# Patient Record
Sex: Female | Born: 1975 | Race: White | Hispanic: No | Marital: Married | Smoking: Current every day smoker
Health system: Southern US, Community
[De-identification: ages and names within clinical notes are randomized; demographics above are authoritative.]

## PROBLEM LIST (undated history)

## (undated) DIAGNOSIS — N838 Other noninflammatory disorders of ovary, fallopian tube and broad ligament: Secondary | ICD-10-CM

## (undated) DIAGNOSIS — I10 Essential (primary) hypertension: Secondary | ICD-10-CM

## (undated) DIAGNOSIS — C801 Malignant (primary) neoplasm, unspecified: Secondary | ICD-10-CM

## (undated) HISTORY — PX: TUBAL LIGATION: SHX77

## (undated) HISTORY — PX: CERVIX REMOVAL: SHX592

## (undated) HISTORY — PX: TONSILLECTOMY: SUR1361

---

## 1998-02-21 ENCOUNTER — Emergency Department (HOSPITAL_COMMUNITY): Admission: EM | Admit: 1998-02-21 | Discharge: 1998-02-21 | Payer: Self-pay | Admitting: Emergency Medicine

## 1998-04-01 ENCOUNTER — Emergency Department (HOSPITAL_COMMUNITY): Admission: EM | Admit: 1998-04-01 | Discharge: 1998-04-01 | Payer: Self-pay | Admitting: Emergency Medicine

## 1998-06-02 ENCOUNTER — Emergency Department (HOSPITAL_COMMUNITY): Admission: EM | Admit: 1998-06-02 | Discharge: 1998-06-02 | Payer: Self-pay | Admitting: Emergency Medicine

## 1999-03-22 ENCOUNTER — Emergency Department (HOSPITAL_COMMUNITY): Admission: EM | Admit: 1999-03-22 | Discharge: 1999-03-22 | Payer: Self-pay | Admitting: Emergency Medicine

## 1999-03-22 ENCOUNTER — Encounter: Payer: Self-pay | Admitting: Emergency Medicine

## 1999-03-29 ENCOUNTER — Emergency Department (HOSPITAL_COMMUNITY): Admission: EM | Admit: 1999-03-29 | Discharge: 1999-03-29 | Payer: Self-pay | Admitting: Emergency Medicine

## 1999-04-23 ENCOUNTER — Encounter: Payer: Self-pay | Admitting: Emergency Medicine

## 1999-04-23 ENCOUNTER — Emergency Department (HOSPITAL_COMMUNITY): Admission: EM | Admit: 1999-04-23 | Discharge: 1999-04-23 | Payer: Self-pay | Admitting: Emergency Medicine

## 1999-05-27 ENCOUNTER — Emergency Department (HOSPITAL_COMMUNITY): Admission: EM | Admit: 1999-05-27 | Discharge: 1999-05-27 | Payer: Self-pay | Admitting: Emergency Medicine

## 1999-11-15 ENCOUNTER — Encounter: Payer: Self-pay | Admitting: *Deleted

## 1999-11-15 ENCOUNTER — Emergency Department (HOSPITAL_COMMUNITY): Admission: EM | Admit: 1999-11-15 | Discharge: 1999-11-15 | Payer: Self-pay | Admitting: Podiatry

## 1999-11-23 ENCOUNTER — Inpatient Hospital Stay (HOSPITAL_COMMUNITY): Admission: AD | Admit: 1999-11-23 | Discharge: 1999-11-23 | Payer: Self-pay | Admitting: *Deleted

## 1999-12-27 ENCOUNTER — Other Ambulatory Visit: Admission: RE | Admit: 1999-12-27 | Discharge: 1999-12-27 | Payer: Self-pay | Admitting: Obstetrics

## 1999-12-27 ENCOUNTER — Encounter (INDEPENDENT_AMBULATORY_CARE_PROVIDER_SITE_OTHER): Payer: Self-pay

## 2000-08-30 ENCOUNTER — Emergency Department (HOSPITAL_COMMUNITY): Admission: EM | Admit: 2000-08-30 | Discharge: 2000-08-30 | Payer: Self-pay | Admitting: Emergency Medicine

## 2000-08-30 ENCOUNTER — Encounter: Payer: Self-pay | Admitting: Emergency Medicine

## 2000-09-15 ENCOUNTER — Encounter: Payer: Self-pay | Admitting: Cardiology

## 2000-09-15 ENCOUNTER — Ambulatory Visit (HOSPITAL_COMMUNITY): Admission: RE | Admit: 2000-09-15 | Discharge: 2000-09-15 | Payer: Self-pay | Admitting: Cardiology

## 2000-11-08 ENCOUNTER — Ambulatory Visit (HOSPITAL_COMMUNITY): Admission: RE | Admit: 2000-11-08 | Discharge: 2000-11-08 | Payer: Self-pay | Admitting: Neurology

## 2000-11-08 ENCOUNTER — Encounter: Payer: Self-pay | Admitting: Neurology

## 2001-01-05 ENCOUNTER — Emergency Department (HOSPITAL_COMMUNITY): Admission: EM | Admit: 2001-01-05 | Discharge: 2001-01-05 | Payer: Self-pay | Admitting: Emergency Medicine

## 2001-04-12 ENCOUNTER — Encounter: Admission: RE | Admit: 2001-04-12 | Discharge: 2001-04-26 | Payer: Self-pay | Admitting: Neurology

## 2001-05-29 ENCOUNTER — Encounter: Admission: RE | Admit: 2001-05-29 | Discharge: 2001-05-29 | Payer: Self-pay | Admitting: Cardiology

## 2001-05-29 ENCOUNTER — Encounter: Payer: Self-pay | Admitting: Cardiology

## 2001-06-07 ENCOUNTER — Emergency Department (HOSPITAL_COMMUNITY): Admission: EM | Admit: 2001-06-07 | Discharge: 2001-06-07 | Payer: Self-pay

## 2001-06-14 ENCOUNTER — Emergency Department (HOSPITAL_COMMUNITY): Admission: EM | Admit: 2001-06-14 | Discharge: 2001-06-14 | Payer: Self-pay | Admitting: Emergency Medicine

## 2001-09-14 ENCOUNTER — Emergency Department (HOSPITAL_COMMUNITY): Admission: EM | Admit: 2001-09-14 | Discharge: 2001-09-14 | Payer: Self-pay | Admitting: *Deleted

## 2001-11-25 ENCOUNTER — Emergency Department (HOSPITAL_COMMUNITY): Admission: EM | Admit: 2001-11-25 | Discharge: 2001-11-25 | Payer: Self-pay | Admitting: Emergency Medicine

## 2001-11-27 ENCOUNTER — Encounter: Admission: RE | Admit: 2001-11-27 | Discharge: 2002-01-09 | Payer: Self-pay | Admitting: Orthopedic Surgery

## 2003-02-13 ENCOUNTER — Emergency Department (HOSPITAL_COMMUNITY): Admission: EM | Admit: 2003-02-13 | Discharge: 2003-02-13 | Payer: Self-pay | Admitting: Emergency Medicine

## 2003-02-13 ENCOUNTER — Encounter: Payer: Self-pay | Admitting: Emergency Medicine

## 2003-09-01 ENCOUNTER — Emergency Department (HOSPITAL_COMMUNITY): Admission: EM | Admit: 2003-09-01 | Discharge: 2003-09-01 | Payer: Self-pay | Admitting: Emergency Medicine

## 2003-09-01 ENCOUNTER — Encounter: Payer: Self-pay | Admitting: Emergency Medicine

## 2004-10-27 ENCOUNTER — Encounter: Admission: RE | Admit: 2004-10-27 | Discharge: 2004-10-27 | Payer: Self-pay | Admitting: Cardiology

## 2005-04-13 ENCOUNTER — Ambulatory Visit (HOSPITAL_COMMUNITY): Admission: RE | Admit: 2005-04-13 | Discharge: 2005-04-13 | Payer: Self-pay | Admitting: *Deleted

## 2005-07-14 ENCOUNTER — Ambulatory Visit (HOSPITAL_COMMUNITY): Admission: RE | Admit: 2005-07-14 | Discharge: 2005-07-14 | Payer: Self-pay | Admitting: *Deleted

## 2005-08-30 ENCOUNTER — Inpatient Hospital Stay (HOSPITAL_COMMUNITY): Admission: AD | Admit: 2005-08-30 | Discharge: 2005-09-01 | Payer: Self-pay | Admitting: *Deleted

## 2005-08-30 ENCOUNTER — Ambulatory Visit: Payer: Self-pay | Admitting: Family Medicine

## 2005-08-31 ENCOUNTER — Encounter (INDEPENDENT_AMBULATORY_CARE_PROVIDER_SITE_OTHER): Payer: Self-pay | Admitting: *Deleted

## 2005-09-02 ENCOUNTER — Inpatient Hospital Stay (HOSPITAL_COMMUNITY): Admission: AD | Admit: 2005-09-02 | Discharge: 2005-09-02 | Payer: Self-pay | Admitting: Obstetrics & Gynecology

## 2006-05-05 ENCOUNTER — Emergency Department (HOSPITAL_COMMUNITY): Admission: EM | Admit: 2006-05-05 | Discharge: 2006-05-06 | Payer: Self-pay | Admitting: Emergency Medicine

## 2007-12-30 ENCOUNTER — Inpatient Hospital Stay (HOSPITAL_COMMUNITY): Admission: EM | Admit: 2007-12-30 | Discharge: 2007-12-31 | Payer: Self-pay | Admitting: Emergency Medicine

## 2008-01-09 ENCOUNTER — Emergency Department (HOSPITAL_COMMUNITY): Admission: EM | Admit: 2008-01-09 | Discharge: 2008-01-09 | Payer: Self-pay | Admitting: Emergency Medicine

## 2008-01-27 ENCOUNTER — Emergency Department (HOSPITAL_COMMUNITY): Admission: EM | Admit: 2008-01-27 | Discharge: 2008-01-27 | Payer: Self-pay | Admitting: Family Medicine

## 2008-01-28 ENCOUNTER — Emergency Department (HOSPITAL_COMMUNITY): Admission: EM | Admit: 2008-01-28 | Discharge: 2008-01-28 | Payer: Self-pay | Admitting: Emergency Medicine

## 2008-02-08 ENCOUNTER — Emergency Department (HOSPITAL_COMMUNITY): Admission: EM | Admit: 2008-02-08 | Discharge: 2008-02-08 | Payer: Self-pay | Admitting: Family Medicine

## 2008-07-10 ENCOUNTER — Emergency Department (HOSPITAL_COMMUNITY): Admission: EM | Admit: 2008-07-10 | Discharge: 2008-07-11 | Payer: Self-pay | Admitting: Emergency Medicine

## 2008-11-04 ENCOUNTER — Emergency Department (HOSPITAL_COMMUNITY): Admission: EM | Admit: 2008-11-04 | Discharge: 2008-11-04 | Payer: Self-pay | Admitting: Emergency Medicine

## 2009-01-02 ENCOUNTER — Emergency Department (HOSPITAL_COMMUNITY): Admission: EM | Admit: 2009-01-02 | Discharge: 2009-01-02 | Payer: Self-pay | Admitting: Family Medicine

## 2009-09-01 ENCOUNTER — Emergency Department (HOSPITAL_COMMUNITY): Admission: EM | Admit: 2009-09-01 | Discharge: 2009-09-01 | Payer: Self-pay | Admitting: Family Medicine

## 2010-05-14 ENCOUNTER — Emergency Department (HOSPITAL_COMMUNITY): Admission: EM | Admit: 2010-05-14 | Discharge: 2010-05-14 | Payer: Self-pay | Admitting: Family Medicine

## 2011-03-14 ENCOUNTER — Other Ambulatory Visit: Payer: Self-pay | Admitting: Rehabilitation

## 2011-03-14 DIAGNOSIS — M545 Low back pain: Secondary | ICD-10-CM

## 2011-03-16 ENCOUNTER — Ambulatory Visit
Admission: RE | Admit: 2011-03-16 | Discharge: 2011-03-16 | Disposition: A | Discharge status: Home / Self Care | Payer: Self-pay | Source: Ambulatory Visit | Attending: Rehabilitation | Admitting: Rehabilitation

## 2011-03-16 DIAGNOSIS — M545 Low back pain: Secondary | ICD-10-CM
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
## 2011-04-08 NOTE — Op Note (Signed)
NAMEMAECIE, SEVCIK            ACCOUNT NO.:  [id number]   MEDICAL RECORD NO.:  [id number]          PATIENT TYPE:  INP   LOCATION:  9106                          FACILITY:  [facility]   PHYSICIAN:  Phil D. Okey Dupre, M.D.     DATE OF BIRTH:  [birth year]   DATE OF PROCEDURE:  08/31/2005  DATE OF DISCHARGE:                                 OPERATIVE REPORT   PROCEDURE:  Bilateral tubal ligation and partial bilateral salpingectomy.   PREOPERATIVE DIAGNOSIS:  Voluntary sterilization.   POSTOPERATIVE DIAGNOSIS:  Voluntary sterilization.   SURGEON:  Javier Glazier. Okey Dupre, M.D.   ANESTHESIA:  General.   ESTIMATED BLOOD LOSS:  10 mL.   SPECIMENS TO PATHOLOGY:  Portion of each fallopian tube.   PROCEDURE:  Under satisfactory general anesthesia, the patient in supine  position, the abdomen was prepped and draped in the usual sterile manner and  entered through a subumbilical semicircular incision just below the  umbilicus.  It was extended for a total length of 4 cm.  The abdomen was  entered by layers and on entering the peritoneal cavity, the right fallopian  tube was identified, grasped in the midportion and followed down to the  fimbriae and an opening made in the avascular portion of broad ligament with  a hemostat, through which was drawn a #1 plain catgut suture, tied around  the distal and proximal ends of the tube to a form a loop above the tie of  approximately 2 cm in length.  A second free tie was placed just below the  aforementioned tie.  The section of tube above the tie was excised, the free  ends of the tube exposed.  It was coagulated with hot cautery.  The same  procedure was carried out with relationship to the left fallopian tube.  A 0  Vicryl suture was then used to close the peritoneum and fascia in one layer  with continuous running suture.  This was run up for subcuticular closure  and Dermabond was used for skin edge closure.  A dry sterile dressing was  applied.  The patient  transferred to the recovery room in satisfactory  condition.  Tape, instrument, sponge count were reported as correct.           ______________________________  Javier Glazier. Okey Dupre, M.D.     PDR/MEDQ  D:  08/31/2005  T:  08/31/2005  Job:  [id number]

## 2011-08-12 LAB — I-STAT 8, (EC8 V) (CONVERTED LAB)
Acid-base deficit: 5 — ABNORMAL HIGH
BUN: 12
Bicarbonate: 20.4
Chloride: 109
Glucose, Bld: 77
HCT: 41
Hemoglobin: 13.9
Operator id: 277751
Potassium: 4.6
Sodium: 137
TCO2: 21
pCO2, Ven: 38 — ABNORMAL LOW
pH, Ven: 7.336 — ABNORMAL HIGH

## 2011-08-12 LAB — RAPID URINE DRUG SCREEN, HOSP PERFORMED
Amphetamines: NOT DETECTED
Barbiturates: NOT DETECTED
Benzodiazepines: POSITIVE — AB
Cocaine: POSITIVE — AB
Opiates: POSITIVE — AB
Tetrahydrocannabinol: POSITIVE — AB

## 2011-08-12 LAB — BASIC METABOLIC PANEL
BUN: 9
CO2: 22
Calcium: 8.8
Chloride: 109
Creatinine, Ser: 0.55
GFR calc Af Amer: >60
GFR calc non Af Amer: >60
Glucose, Bld: 97
Potassium: 4
Sodium: 138

## 2011-08-12 LAB — POCT I-STAT CREATININE
Creatinine, Ser: 0.7
Operator id: 277751

## 2011-08-12 LAB — ETHANOL: Alcohol, Ethyl (B): <5

## 2011-08-12 LAB — CBC
HCT: 36.2
Hemoglobin: 12.2
MCHC: 33.7
MCV: 86.7
Platelets: 109 — ABNORMAL LOW
RBC: 4.17
RDW: 13
WBC: 8.1

## 2012-03-18 ENCOUNTER — Encounter (HOSPITAL_COMMUNITY): Payer: Self-pay | Admitting: Emergency Medicine

## 2012-03-18 ENCOUNTER — Emergency Department (HOSPITAL_COMMUNITY)
Admission: EM | Admit: 2012-03-18 | Discharge: 2012-03-19 | Disposition: A | Discharge status: Home / Self Care | Payer: Medicaid Other | Attending: Emergency Medicine | Admitting: Emergency Medicine

## 2012-03-18 DIAGNOSIS — R110 Nausea: Secondary | ICD-10-CM | POA: Insufficient documentation

## 2012-03-18 DIAGNOSIS — R300 Dysuria: Secondary | ICD-10-CM | POA: Insufficient documentation

## 2012-03-18 DIAGNOSIS — M549 Dorsalgia, unspecified: Secondary | ICD-10-CM | POA: Insufficient documentation

## 2012-03-18 DIAGNOSIS — N949 Unspecified condition associated with female genital organs and menstrual cycle: Secondary | ICD-10-CM | POA: Insufficient documentation

## 2012-03-18 DIAGNOSIS — R109 Unspecified abdominal pain: Secondary | ICD-10-CM | POA: Insufficient documentation

## 2012-03-18 DIAGNOSIS — I10 Essential (primary) hypertension: Secondary | ICD-10-CM | POA: Insufficient documentation

## 2012-03-18 HISTORY — DX: Essential (primary) hypertension: I10

## 2012-03-18 LAB — COMPREHENSIVE METABOLIC PANEL
Albumin: 4.2 g/dL (ref 3.5–5.2)
Alkaline Phosphatase: 56 U/L (ref 39–117)
BUN: 17 mg/dL (ref 6–23)
Calcium: 9.5 mg/dL (ref 8.4–10.5)
Creatinine, Ser: 0.78 mg/dL (ref 0.50–1.10)
GFR calc Af Amer: >90 mL/min (ref >90)
Glucose, Bld: 94 mg/dL (ref 70–99)
Potassium: 4.4 mEq/L (ref 3.5–5.1)
Total Protein: 7.1 g/dL (ref 6.0–8.3)

## 2012-03-18 LAB — DIFFERENTIAL
Basophils Relative: 0 % (ref 0–1)
Eosinophils Absolute: 0.1 10*3/uL (ref 0.0–0.7)
Eosinophils Relative: 2 % (ref 0–5)
Lymphs Abs: 2.4 10*3/uL (ref 0.7–4.0)
Monocytes Absolute: 0.4 10*3/uL (ref 0.1–1.0)
Monocytes Relative: 6 % (ref 3–12)
Neutrophils Relative %: 55 % (ref 43–77)

## 2012-03-18 LAB — URINE MICROSCOPIC-ADD ON

## 2012-03-18 LAB — CBC
Hemoglobin: 13.8 g/dL (ref 12.0–15.0)
MCH: 30.7 pg (ref 26.0–34.0)
MCHC: 34.6 g/dL (ref 30.0–36.0)
MCV: 88.7 fL (ref 78.0–100.0)
RBC: 4.5 MIL/uL (ref 3.87–5.11)

## 2012-03-18 LAB — URINALYSIS, ROUTINE W REFLEX MICROSCOPIC
Glucose, UA: NEGATIVE mg/dL
Ketones, ur: NEGATIVE mg/dL
Nitrite: NEGATIVE
Specific Gravity, Urine: 1.026 (ref 1.005–1.030)
pH: 6 (ref 5.0–8.0)

## 2012-03-18 LAB — LIPASE, BLOOD: Lipase: 78 U/L — ABNORMAL HIGH (ref 11–59)

## 2012-03-18 MED ORDER — ONDANSETRON HCL 4 MG/2ML IJ SOLN
4.0000 mg | Freq: Once | INTRAMUSCULAR | Status: AC
  Administered 2012-03-18: 4 mg via INTRAVENOUS
  Filled 2012-03-18: qty 2

## 2012-03-18 MED ORDER — SODIUM CHLORIDE 0.9 % IV SOLN
1000.0000 mL | Freq: Once | INTRAVENOUS | Status: AC
  Administered 2012-03-18: 1000 mL via INTRAVENOUS

## 2012-03-18 MED ORDER — HYDROMORPHONE HCL PF 1 MG/ML IJ SOLN
1.0000 mg | Freq: Once | INTRAMUSCULAR | Status: AC
  Administered 2012-03-18: 1 mg via INTRAVENOUS
  Filled 2012-03-18: qty 1

## 2012-03-18 MED ORDER — SODIUM CHLORIDE 0.9 % IV SOLN
1000.0000 mL | INTRAVENOUS | Status: DC
  Administered 2012-03-18: 1000 mL via INTRAVENOUS

## 2012-03-18 MED ORDER — IOHEXOL 300 MG/ML  SOLN
20.0000 mL | INTRAMUSCULAR | Status: AC
  Administered 2012-03-18: 20 mL via ORAL

## 2012-03-18 NOTE — ED Notes (Signed)
Bryan CT tech in room explain administration contrast. Pt informed to notify RN after finished drinking.

## 2012-03-18 NOTE — ED Notes (Signed)
Patient with three week history of abdominal pain and vaginal pain.  Patient did see primary care, no relief with pain meds from PCP.

## 2012-03-18 NOTE — ED Notes (Signed)
Pt c/o difficulty voiding. voiding scant amount. abdominal pain  on right side Radiating fron right flank to  rt iguinal

## 2012-03-18 NOTE — ED Provider Notes (Signed)
History     CSN: [id number]  Arrival date & time 03/18/12  2154   First MD Initiated Contact with Patient 03/18/12 2208      Chief Complaint  Patient presents with  . Abdominal Pain   HPI Pt has been having pain in her lower abdomen and vaginal area.  Pt was seen at [facility] center, had a pelvic exam on April 24th and was told that it could be associated with her history of cervical cancer versus ovarian problems.  She was given antibiotics.  She has an ultrasound scheduled on May 1st.  Pt states today the pain became more severe.  No vomiting or diarrhea.  Some nausea.  SHe has some dysuria.  The pain has been getting worse.  Palpation increases the pain.  The antibiotics have not helped.  The pain is in the right lower abdomen and it radiates to her back.  Past Medical History  Diagnosis Date  . Hypertension     History reviewed. No pertinent past surgical history.  History reviewed. No pertinent family history.  History  Substance Use Topics  . Smoking status: Former Games developer  . Smokeless tobacco: Not on file  . Alcohol Use: No    OB History    Grav Para Term Preterm Abortions TAB SAB Ect Mult Living                  Review of Systems  All other systems reviewed and are negative.    Allergies  Review of patient's allergies indicates no known allergies.  Home Medications   Current Outpatient Rx  Name Route Sig Dispense Refill  . ALPRAZOLAM 1 MG PO TABS Oral Take 1 mg by mouth as needed. For anxiety    . HYDROCODONE-ACETAMINOPHEN 5-500 MG PO TABS Oral Take 1 tablet by mouth every 4 (four) hours as needed. For pain      BP 127/57  Temp(Src) 97.9 F (36.6 C) (Oral)  Resp 18  SpO2 100%  LMP 03/12/2012  Physical Exam  Nursing note and vitals reviewed. Constitutional: She appears well-developed and well-nourished. She appears distressed.  HENT:  Head: Normocephalic and atraumatic.  Right Ear: External ear normal.  Left Ear: External ear normal.    Eyes: Conjunctivae are normal. Right eye exhibits no discharge. Left eye exhibits no discharge. No scleral icterus.  Neck: Neck supple. No tracheal deviation present.  Cardiovascular: Normal rate, regular rhythm and intact distal pulses.   Pulmonary/Chest: Effort normal and breath sounds normal. No stridor. No respiratory distress. She has no wheezes. She has no rales.  Abdominal: Soft. Bowel sounds are normal. She exhibits no distension and no mass. There is tenderness in the right upper quadrant. There is tenderness at McBurney's point. There is no rigidity, no rebound, no guarding, no CVA tenderness and negative Murphy's sign. No hernia.  Musculoskeletal: She exhibits no edema and no tenderness.  Neurological: She is alert. She has normal strength. No sensory deficit. Cranial nerve deficit:  no gross defecits noted. She exhibits normal muscle tone. She displays no seizure activity. Coordination normal.  Skin: Skin is warm and dry. No rash noted.  Psychiatric: She has a normal mood and affect.    ED Course  Procedures (including critical care time)  Medications  HYDROcodone-acetaminophen (VICODIN) 5-500 MG per tablet (not administered)  ALPRAZolam (XANAX) 1 MG tablet (not administered)  0.9 %  sodium chloride infusion (1000 mL Intravenous New Bag/Given 03/18/12 2244)    Followed by  0.9 %  sodium  chloride infusion (1000 mL Intravenous New Bag/Given 03/18/12 2339)  iohexol (OMNIPAQUE) 300 MG/ML solution 20 mL (20 mL Oral Contrast Given 03/18/12 2248)  HYDROmorphone (DILAUDID) injection 1 mg (1 mg Intravenous Given 03/18/12 2244)  ondansetron (ZOFRAN) injection 4 mg (4 mg Intravenous Given 03/18/12 2244)    Labs Reviewed  CBC - Abnormal; Notable for the following:    Platelets 129 (*)    All other components within normal limits  LIPASE, BLOOD - Abnormal; Notable for the following:    Lipase 78 (*)    All other components within normal limits  DIFFERENTIAL  COMPREHENSIVE METABOLIC PANEL   URINALYSIS, ROUTINE W REFLEX MICROSCOPIC   No results found.   No diagnosis found.    MDM  Pelvic exam was deferred. Patient recently had that done 2 days ago by her gynecologist. With her persistent pain a CT abdomen pelvis has been ordered to evaluate for appendicitis or other abdominal pathology.  Case will be turned over to Dr Hyacinth Meeker to follow up on the CT scan       Celene Kras, MD 03/18/12 [phone]

## 2012-03-19 ENCOUNTER — Emergency Department (HOSPITAL_COMMUNITY): Payer: Medicaid Other

## 2012-03-19 ENCOUNTER — Encounter (HOSPITAL_COMMUNITY): Payer: Self-pay | Admitting: Radiology

## 2012-03-19 MED ORDER — OXYCODONE-ACETAMINOPHEN 5-325 MG PO TABS: 1.0000 | ORAL_TABLET | ORAL | Status: AC | PRN

## 2012-03-19 MED ORDER — ONDANSETRON HCL 4 MG/2ML IJ SOLN
4.0000 mg | Freq: Once | INTRAMUSCULAR | Status: AC
  Administered 2012-03-19: 4 mg via INTRAVENOUS
  Filled 2012-03-19: qty 2

## 2012-03-19 MED ORDER — IOHEXOL 300 MG/ML  SOLN
100.0000 mL | Freq: Once | INTRAMUSCULAR | Status: AC | PRN
  Administered 2012-03-19: 100 mL via INTRAVENOUS

## 2012-03-19 MED ORDER — OXYCODONE-ACETAMINOPHEN 5-325 MG PO TABS
2.0000 | ORAL_TABLET | Freq: Once | ORAL | Status: AC
  Administered 2012-03-19: 2 via ORAL
  Filled 2012-03-19: qty 2

## 2012-03-19 MED ORDER — PROMETHAZINE HCL 25 MG PO TABS: 25.0000 mg | ORAL_TABLET | Freq: Four times a day (QID) | ORAL | Status: DC | PRN

## 2012-03-19 NOTE — ED Notes (Signed)
Pt states understanding of discharge instructions 

## 2012-03-19 NOTE — Discharge Instructions (Signed)
Your CT scan showed mild possible pancreatitis - your exam is not consistent with this diagnosis - that being said the CT scan did not show any other abnormalities in the area that you are hurting on the right side.  Please drink a clear liquid diet for the next 2 days, then slow progress the diet - see the attached reading instructions.  Percocet for pain / phenergan for vomiting.   Return to the hospital for severe or worsening symptoms.  You have been diagnosed with undifferentiated abdominal pain.  Abdominal pain can be caused by many things. Your caregiver evaluates the seriousness of your pain by an examination and possibly blood or urine tests and imaging (CT scan, x-rays, ultrasound). Many cases can be observed and treated at home after initial evaluation in the emergency department. Even though you are being discharged home, abdominal pain can be unpredictable. Therefore, you need a repeat exam if your pain does not resolve, returns, or worsens. Most patient's with abdominal pain do not need to be admitted to the hospital or have surgery, but serious problems like appendicitis and gallbladder attacks can start out as nonspecific pain. Many abdominal conditions cannot be diagnosed in 1 visit, so followup evaluations are very important.  Seek immediate medical attention if:  *The pain does not go away or becomes severe. *Temperature above 101 develops *Repeated vomiting occurs(multiple episodes) *The pain becomes localized to portions of the abdomen. The right side could possibly be appendicitis. In an adult, the left lower portion of the abdomen could be colitis or diverticulitis. *Blood is being passed in stools or vomit *Return also if you develop chest pain, difficulty breathing, dizziness or fainting, or become confused poorly responsive or inconsolable (young children).     If you do not have a physician, you should reference the below phone numbers and call in the morning to  establish follow up care.  RESOURCE GUIDE  Dental Problems  Patients with Medicaid: [facility] [phone] [address]                                           [phone] Phone:  [phone]                                                  Phone:  [phone]  If unable to pay or uninsured, contact:  [facility] or [facility]. to become qualified for the adult dental clinic.  Chronic Pain Problems Contact [facility]  [phone] Patients need to be referred by their primary care doctor.  Insufficient Money for Medicine Contact [employer]:  call "211" or [facility] [phone].  No Primary Care Doctor Call [facility]  [phone] Other agencies that provide inexpensive medical care    [facility]  [phone]    [facility]  [phone]    [facility]  [phone]    [facility]  [phone]    [facility]  [phone]    [facility]  [phone]  Psychological Services [facility]  [phone]  [employer] [facility]  [phone] [facility]   [phone] (emergency services ([phone])  Substance Abuse Resources [facility]  ([phone] [facility] [phone] [facility] [place] [phone] Floydene Flock [phone] Residential & Outpatient Substance Abuse Program  ([phone]  Abuse/Neglect [facility] Child Abuse Hotline 412-783-1912 [facility] Child Abuse Hotline (314) 676-1007 (After Hours)  Emergency Shelter [facility] (918)342-9792  [facility] [place] [facility] [place] 4587303096 [facility] [phone]  MRSA Hotline #:   636-809-5060    [facility] Resources  [facility]     [employer]                          [facility] Dept. [address] [place]                        [address]      [address]  Blondell Reveal Phone:  [phone]                                   Phone:  [phone]                 Phone:  [phone]  [facility] Phone:  [phone]  [facility] Child Abuse Hotline [phone] (After Hours)

## 2012-03-19 NOTE — ED Provider Notes (Signed)
  Physical Exam  BP 113/62  Pulse 54  Temp(Src) 96.8 F (36 C) (Oral)  Resp 18  SpO2 100%  LMP 03/12/2012  Physical Exam  ED Course  Procedures  MDM Pt accepted at change of shift from Dr. Lynelle Doctor - at this time she has had significant improvement in her pain with percocet - has been informed by myself of all the results including the borderline lipase and CT findings - she has tolerated clear liquids and I have educated pt on treatment for pancreatitis including clear liquids with slow progression of diet.    That being said, her pain on exam and on my reexa shows R side pain, no RUQ or epigastric pain.  There is no evidence on the CT scan of other more acute processes causing pain in that area.  VS are normal, pt has ambulated in the dept and has agreed to f/u should her sx worsen.      Alyssa Roller, MD 03/19/12 [phone]

## 2012-04-10 ENCOUNTER — Emergency Department (HOSPITAL_COMMUNITY)
Admission: EM | Admit: 2012-04-10 | Discharge: 2012-04-11 | Disposition: A | Discharge status: Home / Self Care | Payer: Medicaid Other | Attending: Emergency Medicine | Admitting: Emergency Medicine

## 2012-04-10 ENCOUNTER — Encounter (HOSPITAL_COMMUNITY): Payer: Self-pay | Admitting: Emergency Medicine

## 2012-04-10 DIAGNOSIS — H538 Other visual disturbances: Secondary | ICD-10-CM | POA: Insufficient documentation

## 2012-04-10 DIAGNOSIS — R51 Headache: Secondary | ICD-10-CM | POA: Insufficient documentation

## 2012-04-10 DIAGNOSIS — I10 Essential (primary) hypertension: Secondary | ICD-10-CM | POA: Insufficient documentation

## 2012-04-10 DIAGNOSIS — Z79899 Other long term (current) drug therapy: Secondary | ICD-10-CM | POA: Insufficient documentation

## 2012-04-10 DIAGNOSIS — R112 Nausea with vomiting, unspecified: Secondary | ICD-10-CM | POA: Insufficient documentation

## 2012-04-10 NOTE — ED Notes (Signed)
PT. REPORTS HEADACHE THIS EVENING , DENIES INJURY , NO NAUSEA OR BLURRED VISION .

## 2012-04-11 ENCOUNTER — Emergency Department (HOSPITAL_COMMUNITY): Payer: Medicaid Other

## 2012-04-11 ENCOUNTER — Encounter (HOSPITAL_COMMUNITY): Payer: Self-pay | Admitting: Radiology

## 2012-04-11 MED ORDER — ONDANSETRON HCL 4 MG/2ML IJ SOLN
4.0000 mg | Freq: Once | INTRAMUSCULAR | Status: AC
  Administered 2012-04-11: 4 mg via INTRAVENOUS
  Filled 2012-04-11: qty 2

## 2012-04-11 MED ORDER — METOCLOPRAMIDE HCL 5 MG/ML IJ SOLN
10.0000 mg | Freq: Once | INTRAMUSCULAR | Status: AC
  Administered 2012-04-11: 10 mg via INTRAVENOUS
  Filled 2012-04-11: qty 2

## 2012-04-11 MED ORDER — DIPHENHYDRAMINE HCL 50 MG/ML IJ SOLN
25.0000 mg | Freq: Once | INTRAMUSCULAR | Status: AC
  Administered 2012-04-11: 25 mg via INTRAVENOUS
  Filled 2012-04-11: qty 1

## 2012-04-11 MED ORDER — KETOROLAC TROMETHAMINE 30 MG/ML IJ SOLN
30.0000 mg | Freq: Once | INTRAMUSCULAR | Status: AC
  Administered 2012-04-11: 30 mg via INTRAVENOUS
  Filled 2012-04-11: qty 1

## 2012-04-11 MED ORDER — SODIUM CHLORIDE 0.9 % IV BOLUS (SEPSIS)
500.0000 mL | INTRAVENOUS | Status: AC
  Administered 2012-04-11: 500 mL via INTRAVENOUS

## 2012-04-11 NOTE — ED Notes (Signed)
Nurse at bedside, pt vomiting at this time. Waiting to be seen by MD

## 2012-04-11 NOTE — ED Provider Notes (Signed)
Medical screening examination/treatment/procedure(s) were performed by non-physician practitioner and as supervising physician I was immediately available for consultation/collaboration.    Vida Roller, MD 04/11/12 [phone]
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
## 2012-04-11 NOTE — ED Notes (Signed)
PT. VOMITTING AT TRIAGE.

## 2012-04-11 NOTE — Discharge Instructions (Signed)
General Headache, Without Cause A general headache has no specific cause. These headaches are not life-threatening. They will not lead to other types of headaches. HOME CARE   Make and keep follow-up visits with your doctor.   Only take medicine as told by your doctor.   Try to relax, get a massage, or use your thoughts to control your body (biofeedback).   Apply cold or heat to the head and neck. Apply 3 or 4 times a day or as needed.  Finding out the results of your test Ask when your test results will be ready. Make sure you get your test results. GET HELP RIGHT AWAY IF:   You have problems with medicine.   Your medicine does not help relieve pain.   Your headache changes or becomes worse.   You feel sick to your stomach (nauseous) or throw up (vomit).   You have a temperature by mouth above 102 F (38.9 C), not controlled by medicine.   Your have a stiff neck.   You have vision loss.   You have muscle weakness.   You lose control of your muscles.   You lose balance or have trouble walking.   You feel like you are going to pass out (faint).  MAKE SURE YOU:   Understand these instructions.   Will watch this condition.   Will get help right away if you are not doing well or get worse.  Document Released: 08/16/2008 Document Revised: 10/27/2011 Document Reviewed: 08/16/2008 [employer] Patient Information 2012 [employer]. 

## 2012-09-11 ENCOUNTER — Emergency Department (INDEPENDENT_AMBULATORY_CARE_PROVIDER_SITE_OTHER)
Admission: EM | Admit: 2012-09-11 | Discharge: 2012-09-11 | Disposition: A | Discharge status: Home / Self Care | Payer: Medicaid Other | Source: Home / Self Care | Attending: Family Medicine | Admitting: Family Medicine

## 2012-09-11 ENCOUNTER — Encounter (HOSPITAL_COMMUNITY): Payer: Self-pay | Admitting: Emergency Medicine

## 2012-09-11 DIAGNOSIS — L02414 Cutaneous abscess of left upper limb: Secondary | ICD-10-CM

## 2012-09-11 DIAGNOSIS — IMO0002 Reserved for concepts with insufficient information to code with codable children: Secondary | ICD-10-CM

## 2012-09-11 HISTORY — DX: Malignant (primary) neoplasm, unspecified: C80.1

## 2012-09-11 MED ORDER — HYDROCODONE-ACETAMINOPHEN 5-325 MG PO TABS
1.0000 | ORAL_TABLET | Freq: Once | ORAL | Status: AC
  Administered 2012-09-11: 1 via ORAL

## 2012-09-11 MED ORDER — HYDROCODONE-ACETAMINOPHEN 5-500 MG PO TABS: 1.0000 | ORAL_TABLET | ORAL | Status: DC | PRN

## 2012-09-11 MED ORDER — IBUPROFEN 800 MG PO TABS
800.0000 mg | ORAL_TABLET | Freq: Once | ORAL | Status: AC
  Administered 2012-09-11: 800 mg via ORAL

## 2012-09-11 MED ORDER — IBUPROFEN 800 MG PO TABS: 800.0000 mg | ORAL_TABLET | Freq: Three times a day (TID) | ORAL | Status: DC

## 2012-09-11 MED ORDER — DOXYCYCLINE HYCLATE 100 MG PO CAPS: 100.0000 mg | ORAL_CAPSULE | Freq: Two times a day (BID) | ORAL | Status: DC

## 2012-09-11 MED ORDER — IBUPROFEN 800 MG PO TABS
ORAL_TABLET | ORAL | Status: AC
  Filled 2012-09-11: qty 1

## 2012-09-11 MED ORDER — HYDROCODONE-ACETAMINOPHEN 5-325 MG PO TABS
ORAL_TABLET | ORAL | Status: AC
  Filled 2012-09-11: qty 1
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
## 2012-09-11 NOTE — ED Provider Notes (Signed)
History     CSN: [id number]  Arrival date & time 09/11/12  1409   First MD Initiated Contact with Patient 09/11/12 1612      Chief Complaint  Patient presents with  . Abscess    (Consider location/radiation/quality/duration/timing/severity/associated sxs/prior treatment) Patient is a 36 y.o. female presenting with abscess. The history is provided by the patient.  Abscess  This is a new problem. The current episode started more than one week ago. The onset was gradual. The problem has been unchanged. The abscess is characterized by redness and burning.   This patient presents for evaluation of a cutaneous abscess.  The lesion is located in the Left shoulder.  Onset was 1 week ago.  Symptoms have been worsening.  Abscess has associated symptoms of drainage and pain   .  The patient does have previous history of cutaneous abscesses.  There is no known previous history of MRSA.  Family member with history of same condition. They do not have a history of diabetes. Has had I and D of previous abscesses.  Past Medical History  Diagnosis Date  . Hypertension   . Cancer     History reviewed. No pertinent past surgical history.  No family history on file.  History  Substance Use Topics  . Smoking status: Former Games developer  . Smokeless tobacco: Not on file  . Alcohol Use: No    OB History    Grav Para Term Preterm Abortions TAB SAB Ect Mult Living                  Review of Systems  Constitutional: Negative.   Respiratory: Negative.   Cardiovascular: Negative.   Musculoskeletal: Positive for arthralgias. Negative for joint swelling.  Skin: Positive for wound.    Allergies  Review of patient's allergies indicates no known allergies.  Home Medications   Current Outpatient Rx  Name Route Sig Dispense Refill  . ALPRAZOLAM 1 MG PO TABS Oral Take 1 mg by mouth 3 (three) times daily as needed. For anxiety    . DOXYCYCLINE HYCLATE 100 MG PO CAPS Oral Take 1 capsule (100  mg total) by mouth 2 (two) times daily. 20 capsule 0  . HYDROCODONE-ACETAMINOPHEN 5-500 MG PO TABS Oral Take 1 tablet by mouth every 4 (four) hours as needed. For pain 10 tablet 0  . IBUPROFEN 800 MG PO TABS Oral Take 1 tablet (800 mg total) by mouth 3 (three) times daily. 30 tablet 0  . PROMETHAZINE HCL 25 MG PO TABS Oral Take 1 tablet (25 mg total) by mouth every 6 (six) hours as needed for nausea. 12 tablet 0    BP 144/87  Pulse 60  Temp 98.2 F (36.8 C) (Oral)  Resp 16  SpO2 100%  LMP 09/06/2012  Physical Exam  Nursing note and vitals reviewed. Constitutional: She is oriented to person, place, and time. Vital signs are normal. She appears well-developed and well-nourished. She is active and cooperative.  HENT:  Head: Normocephalic.  Eyes: Conjunctivae normal are normal. Pupils are equal, round, and reactive to light. No scleral icterus.  Neck: Trachea normal. Neck supple.  Cardiovascular: Normal rate and regular rhythm.   Pulmonary/Chest: Effort normal and breath sounds normal.  Musculoskeletal: Normal range of motion.       Left shoulder: Normal.  Neurological: She is alert and oriented to person, place, and time. No cranial nerve deficit or sensory deficit.  Skin: Skin is warm and dry. Lesion noted.     Psychiatric: She  has a normal mood and affect. Her speech is normal and behavior is normal. Judgment and thought content normal. Cognition and memory are normal.    ED Course  Procedures (including critical care time)  Labs Reviewed - No data to display No results found.   1. Abscess of left shoulder       MDM  Wash skin with antibacterial soap. May use Lever 2002 or Dial. RTC in 3 days for wound check. Take antibiotic as prescribed. Take analgesic with food, as needed.         Johnsie Kindred, NP 09/11/12 1629

## 2012-09-11 NOTE — ED Notes (Signed)
Boil to back of left shoulder for one week.  Reports husband and son have both have abscess (mrsa)

## 2012-09-12 NOTE — ED Provider Notes (Signed)
Medical screening examination/treatment/procedure(s) were performed by resident physician or non-physician practitioner and as supervising physician I was immediately available for consultation/collaboration.   Barkley Bruns MD.    Linna Hoff, MD 09/12/12 [phone]

## 2013-05-20 ENCOUNTER — Encounter: Payer: Self-pay | Admitting: Obstetrics

## 2013-06-10 ENCOUNTER — Ambulatory Visit: Payer: Self-pay | Admitting: Obstetrics

## 2013-08-02 ENCOUNTER — Encounter (HOSPITAL_COMMUNITY): Payer: Self-pay | Admitting: *Deleted

## 2013-08-02 ENCOUNTER — Ambulatory Visit (HOSPITAL_COMMUNITY)
Admission: AD | Admit: 2013-08-02 | Discharge: 2013-08-02 | Disposition: A | Discharge status: Home / Self Care | Payer: Medicaid Other | Attending: Psychiatry | Admitting: Psychiatry

## 2013-08-02 ENCOUNTER — Encounter (HOSPITAL_COMMUNITY): Payer: Self-pay | Admitting: Emergency Medicine

## 2013-08-02 ENCOUNTER — Emergency Department (HOSPITAL_COMMUNITY)
Admission: EM | Admit: 2013-08-02 | Discharge: 2013-08-02 | Disposition: A | Discharge status: Home / Self Care | Payer: Medicaid Other | Attending: Emergency Medicine | Admitting: Emergency Medicine

## 2013-08-02 DIAGNOSIS — Z87891 Personal history of nicotine dependence: Secondary | ICD-10-CM | POA: Insufficient documentation

## 2013-08-02 DIAGNOSIS — Z3202 Encounter for pregnancy test, result negative: Secondary | ICD-10-CM | POA: Insufficient documentation

## 2013-08-02 DIAGNOSIS — F329 Major depressive disorder, single episode, unspecified: Secondary | ICD-10-CM | POA: Insufficient documentation

## 2013-08-02 DIAGNOSIS — F1420 Cocaine dependence, uncomplicated: Secondary | ICD-10-CM | POA: Insufficient documentation

## 2013-08-02 DIAGNOSIS — N838 Other noninflammatory disorders of ovary, fallopian tube and broad ligament: Secondary | ICD-10-CM

## 2013-08-02 DIAGNOSIS — F3289 Other specified depressive episodes: Secondary | ICD-10-CM | POA: Insufficient documentation

## 2013-08-02 DIAGNOSIS — I10 Essential (primary) hypertension: Secondary | ICD-10-CM | POA: Insufficient documentation

## 2013-08-02 DIAGNOSIS — F1410 Cocaine abuse, uncomplicated: Secondary | ICD-10-CM

## 2013-08-02 HISTORY — DX: Other noninflammatory disorders of ovary, fallopian tube and broad ligament: N83.8
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
## 2013-08-02 NOTE — ED Notes (Signed)
Pt from home requesting detox from "crack cocaine". Pt last used yesterday. Pt states that she doesn't use everyday and wants to "get clean so I can take care of my son.". Pt denies using ETOH other than socially and states that she takes prescribed Vicodin for cervical cancer and Xanax for anxiety. Pt is A&O and in NAD. Pt denies CP, SOB, fever.

## 2013-08-02 NOTE — ED Provider Notes (Signed)
CSN: [id number]     Arrival date & time 08/02/13  0745 History   First MD Initiated Contact with Patient 08/02/13 0750     Chief Complaint  Patient presents with  . Addiction Problem   (Consider location/radiation/quality/duration/timing/severity/associated sxs/prior Treatment) HPI Comments:  request for detox from crack cocaine.  Pt denies SI/HI, other substance of abuse. She uses crack every weekly or once every other week.  Last use 2 days ago.  She reports no hx of withdraw symptoms in between use.  Patient is a 37 y.o. female presenting with drug problem. The history is provided by the patient. No language interpreter was used.  Drug Problem This is a chronic problem. The current episode started more than 1 week ago. The problem occurs rarely (2-4 times monthly). The problem has not changed since onset.Pertinent negatives include no chest pain, no abdominal pain, no headaches and no shortness of breath. Nothing aggravates the symptoms. Nothing relieves the symptoms. She has tried nothing for the symptoms. The treatment provided no relief.    Past Medical History  Diagnosis Date  . Hypertension   . Cancer    Past Surgical History  Procedure Laterality Date  . Cervix removal      partial  . Tonsillectomy     No family history on file. History  Substance Use Topics  . Smoking status: Former Games developer  . Smokeless tobacco: Not on file  . Alcohol Use: Yes     Comment: socailly   OB History   Grav Para Term Preterm Abortions TAB SAB Ect Mult Living                 Review of Systems  Constitutional: Negative for fever, chills, diaphoresis, activity change, appetite change and fatigue.  HENT: Negative for congestion, sore throat, facial swelling, rhinorrhea, neck pain and neck stiffness.   Eyes: Negative for photophobia and discharge.  Respiratory: Negative for cough, chest tightness and shortness of breath.   Cardiovascular: Negative for chest pain, palpitations and leg  swelling.  Gastrointestinal: Negative for nausea, vomiting, abdominal pain and diarrhea.  Endocrine: Negative for polydipsia and polyuria.  Genitourinary: Negative for dysuria, frequency, difficulty urinating and pelvic pain.  Musculoskeletal: Negative for back pain and arthralgias.  Skin: Negative for color change and wound.  Allergic/Immunologic: Negative for immunocompromised state.  Neurological: Negative for facial asymmetry, weakness, numbness and headaches.  Hematological: Does not bruise/bleed easily.  Psychiatric/Behavioral: Negative for suicidal ideas, hallucinations, confusion, sleep disturbance, self-injury, dysphoric mood and agitation. The patient is not nervous/anxious.     Allergies  Review of patient's allergies indicates no known allergies.  Home Medications   Current Outpatient Rx  Name  Route  Sig  Dispense  Refill  . ALPRAZolam (XANAX) 1 MG tablet   Oral   Take 1 mg by mouth 3 (three) times daily as needed. For anxiety         . HYDROcodone-acetaminophen (NORCO/VICODIN) 5-325 MG per tablet   Oral   Take 1-2 tablets by mouth every 6 (six) hours as needed for pain.          BP 114/63  Pulse 66  Temp(Src) 97.6 F (36.4 C) (Oral)  Resp 20  SpO2 97%  LMP 08/01/2013 Physical Exam  Constitutional: She is oriented to person, place, and time. She appears well-developed and well-nourished. No distress.  HENT:  Head: Normocephalic and atraumatic.  Mouth/Throat: No oropharyngeal exudate.  Eyes: Pupils are equal, round, and reactive to light.  Neck: Normal range of  motion. Neck supple.  Cardiovascular: Normal rate, regular rhythm and normal heart sounds.  Exam reveals no gallop and no friction rub.   No murmur heard. Pulmonary/Chest: Effort normal and breath sounds normal. No respiratory distress. She has no wheezes. She has no rales.  Abdominal: Soft. Bowel sounds are normal. She exhibits no distension and no mass. There is no tenderness. There is no rebound  and no guarding.  Musculoskeletal: Normal range of motion. She exhibits no edema and no tenderness.  Neurological: She is alert and oriented to person, place, and time. She has normal strength. No cranial nerve deficit or sensory deficit. Gait normal. GCS eye subscore is 4. GCS verbal subscore is 5. GCS motor subscore is 6.  Skin: Skin is warm and dry.  Psychiatric: She has a normal mood and affect.    ED Course  Procedures (including critical care time) Labs Review Labs Reviewed  POCT PREGNANCY, URINE   Imaging Review No results found.  MDM   1. Cocaine abuse    Pt is a 37 y.o. female with Pmhx as above who presents with request for detox from crack cocaine.  Pt denies SI/HI, other substance of abuse. She uses crack every weekly or once every other week.  Last use 2 days ago.  She reports no hx of withdraw symptoms in between use. On exam, VSS, pt in NAD.  No neuro findings. I do not believe she would meet inpt admission criteria given her infrequent use.  Outpt resources given and pt agrees to f/u.  Return precautions given for new or worsening symptoms including SI/HI, concern for her safety.   1. Cocaine abuse         Shanna Cisco, MD 08/02/13 1623

## 2013-08-29 LAB — CBC
MCH: 31.4 pg (ref 26.0–34.0)
MCHC: 34.5 g/dL (ref 32.0–36.0)
Platelet: 116 10*3/uL — ABNORMAL LOW (ref 150–440)
RBC: 4.36 10*6/uL (ref 3.80–5.20)
WBC: 7.1 10*3/uL (ref 3.6–11.0)

## 2013-08-30 ENCOUNTER — Inpatient Hospital Stay: Payer: Self-pay | Admitting: Psychiatry

## 2013-08-30 LAB — ETHANOL
Ethanol %: <0.003 % (ref 0.000–0.080)
Ethanol: <3 mg/dL

## 2013-08-30 LAB — COMPREHENSIVE METABOLIC PANEL
Albumin: 4.1 g/dL (ref 3.4–5.0)
BUN: 9 mg/dL (ref 7–18)
Calcium, Total: 8.6 mg/dL (ref 8.5–10.1)
Co2: 25 mmol/L (ref 21–32)
EGFR (African American): >60
EGFR (Non-African Amer.): >60
Glucose: 119 mg/dL — ABNORMAL HIGH (ref 65–99)
Osmolality: 272 (ref 275–301)
Potassium: 3.3 mmol/L — ABNORMAL LOW (ref 3.5–5.1)
SGOT(AST): 23 U/L (ref 15–37)

## 2013-08-30 LAB — URINALYSIS, COMPLETE
Bacteria: NONE SEEN
Bilirubin,UR: NEGATIVE
Blood: NEGATIVE
Glucose,UR: NEGATIVE mg/dL (ref 0–75)
Nitrite: NEGATIVE
Protein: NEGATIVE
Specific Gravity: 1.008 (ref 1.003–1.030)

## 2013-08-30 LAB — DRUG SCREEN, URINE
Barbiturates, Ur Screen: NEGATIVE (ref ?–200)
Benzodiazepine, Ur Scrn: NEGATIVE (ref ?–200)
Cocaine Metabolite,Ur ~~LOC~~: POSITIVE (ref ?–300)
MDMA (Ecstasy)Ur Screen: NEGATIVE (ref ?–500)
Methadone, Ur Screen: NEGATIVE (ref ?–300)
Phencyclidine (PCP) Ur S: NEGATIVE (ref ?–25)

## 2013-08-30 LAB — TSH: Thyroid Stimulating Horm: 8.26 u[IU]/mL — ABNORMAL HIGH

## 2013-09-16 ENCOUNTER — Emergency Department (HOSPITAL_COMMUNITY)
Admission: EM | Admit: 2013-09-16 | Discharge: 2013-09-16 | Disposition: A | Discharge status: Home / Self Care | Payer: Medicaid Other | Source: Home / Self Care | Attending: Family Medicine | Admitting: Family Medicine

## 2013-09-16 ENCOUNTER — Encounter (HOSPITAL_COMMUNITY): Payer: Self-pay | Admitting: Emergency Medicine

## 2013-09-16 DIAGNOSIS — H6012 Cellulitis of left external ear: Secondary | ICD-10-CM

## 2013-09-16 DIAGNOSIS — H60399 Other infective otitis externa, unspecified ear: Secondary | ICD-10-CM

## 2013-09-16 MED ORDER — CEPHALEXIN 500 MG PO CAPS: 500.0000 mg | ORAL_CAPSULE | Freq: Four times a day (QID) | ORAL | Status: DC

## 2013-09-16 MED ORDER — HYDROCODONE-ACETAMINOPHEN 5-325 MG PO TABS: 2.0000 | ORAL_TABLET | Freq: Four times a day (QID) | ORAL | Status: DC | PRN

## 2013-09-16 NOTE — ED Provider Notes (Signed)
CSN: [id number]     Arrival date & time 09/16/13  1921 History   First MD Initiated Contact with Patient 09/16/13 2006     Chief Complaint  Patient presents with  . Otalgia   (Consider location/radiation/quality/duration/timing/severity/associated sxs/prior Treatment) Patient is a 37 y.o. female presenting with ear pain. The history is provided by the patient.  Otalgia Location:  Left Quality:  Throbbing Severity:  Moderate Onset quality:  Gradual Duration:  1 week Timing:  Constant Progression:  Worsening Chronicity:  Recurrent (h/o similar problem on right seen by dr Pollyann Kennedy.) Relieved by:  None tried Associated symptoms: neck pain   Associated symptoms: no ear discharge and no fever   Risk factors: prior ear surgery     Past Medical History  Diagnosis Date  . Hypertension   . Cancer   . Cyst of fallopian tube 08/02/2013   Past Surgical History  Procedure Laterality Date  . Cervix removal      partial  . Tonsillectomy     History reviewed. No pertinent family history. History  Substance Use Topics  . Smoking status: Former Games developer  . Smokeless tobacco: Not on file  . Alcohol Use: Yes     Comment: socailly   OB History   Grav Para Term Preterm Abortions TAB SAB Ect Mult Living                 Review of Systems  Constitutional: Negative.  Negative for fever.  HENT: Positive for ear pain. Negative for ear discharge and facial swelling.   Musculoskeletal: Positive for neck pain.    Allergies  Bee venom  Home Medications   Current Outpatient Rx  Name  Route  Sig  Dispense  Refill  . ALPRAZolam (XANAX) 1 MG tablet   Oral   Take 1 mg by mouth 3 (three) times daily as needed. For anxiety         . cephALEXin (KEFLEX) 500 MG capsule   Oral   Take 1 capsule (500 mg total) by mouth 4 (four) times daily. Take all of medicine and drink lots of fluids   20 capsule   0   . HYDROcodone-acetaminophen (NORCO/VICODIN) 5-325 MG per tablet   Oral   Take 1-2  tablets by mouth every 6 (six) hours as needed for pain.         Marland Kitchen HYDROcodone-acetaminophen (NORCO/VICODIN) 5-325 MG per tablet   Oral   Take 2 tablets by mouth every 6 (six) hours as needed for pain.   10 tablet   0    BP 117/75  Pulse 70  Temp(Src) 98.2 F (36.8 C) (Oral)  Resp 16  SpO2 99% Physical Exam  Nursing note and vitals reviewed. Constitutional: She appears well-developed and well-nourished. She appears distressed.  HENT:  Head: Normocephalic.  Right Ear: External ear normal.  Left Ear: There is swelling and tenderness. No mastoid tenderness. Tympanic membrane is erythematous and bulging. No hemotympanum.  Mouth/Throat: Oropharynx is clear and moist.    ED Course  Procedures (including critical care time) Labs Review Labs Reviewed - No data to display Imaging Review No results found.    MDM      Linna Hoff, MD 09/16/13 2030

## 2013-09-16 NOTE — ED Notes (Signed)
Computer in treatment room is not working

## 2013-09-16 NOTE — ED Notes (Signed)
Left ear pain with onset 7 days ago.  Also feels soreness in left side of neck.

## 2013-09-17 ENCOUNTER — Encounter (HOSPITAL_COMMUNITY): Payer: Self-pay | Admitting: Emergency Medicine

## 2013-09-17 ENCOUNTER — Emergency Department (HOSPITAL_COMMUNITY)
Admission: EM | Admit: 2013-09-17 | Discharge: 2013-09-17 | Disposition: A | Discharge status: Home / Self Care | Payer: Medicaid Other | Attending: Emergency Medicine | Admitting: Emergency Medicine

## 2013-09-17 DIAGNOSIS — H9202 Otalgia, left ear: Secondary | ICD-10-CM

## 2013-09-17 DIAGNOSIS — Z792 Long term (current) use of antibiotics: Secondary | ICD-10-CM | POA: Insufficient documentation

## 2013-09-17 DIAGNOSIS — Z87891 Personal history of nicotine dependence: Secondary | ICD-10-CM | POA: Insufficient documentation

## 2013-09-17 DIAGNOSIS — H9209 Otalgia, unspecified ear: Secondary | ICD-10-CM | POA: Insufficient documentation

## 2013-09-17 DIAGNOSIS — Z79899 Other long term (current) drug therapy: Secondary | ICD-10-CM | POA: Insufficient documentation

## 2013-09-17 DIAGNOSIS — Z8742 Personal history of other diseases of the female genital tract: Secondary | ICD-10-CM | POA: Insufficient documentation

## 2013-09-17 DIAGNOSIS — I10 Essential (primary) hypertension: Secondary | ICD-10-CM | POA: Insufficient documentation

## 2013-09-17 DIAGNOSIS — Z859 Personal history of malignant neoplasm, unspecified: Secondary | ICD-10-CM | POA: Insufficient documentation

## 2013-09-17 DIAGNOSIS — IMO0002 Reserved for concepts with insufficient information to code with codable children: Secondary | ICD-10-CM | POA: Insufficient documentation

## 2013-09-17 MED ORDER — ANTIPYRINE-BENZOCAINE 5.4-1.4 % OT SOLN
3.0000 [drp] | Freq: Once | OTIC | Status: AC
  Administered 2013-09-17: 4 [drp] via OTIC
  Filled 2013-09-17: qty 10

## 2013-09-17 MED ORDER — OXYCODONE-ACETAMINOPHEN 5-325 MG PO TABS: 1.0000 | ORAL_TABLET | ORAL | Status: DC | PRN

## 2013-09-17 MED ORDER — OXYCODONE-ACETAMINOPHEN 5-325 MG PO TABS
1.0000 | ORAL_TABLET | Freq: Once | ORAL | Status: AC
  Administered 2013-09-17: 1 via ORAL
  Filled 2013-09-17: qty 1

## 2013-09-17 NOTE — ED Provider Notes (Signed)
CSN: [id number]     Arrival date & time 09/17/13  1845 History   First MD Initiated Contact with Patient 09/17/13 2037     Chief Complaint  Patient presents with  . Otalgia   (Consider location/radiation/quality/duration/timing/severity/associated sxs/prior Treatment) HPI History provided by pt and prior chart. Pt c/o one week severe, non-traumatic, L inner ear pain that radiates to the entire L side of her face.  No associated fever, otorrhea, nasal congestion, rhinorrhea, sore throat.  Has had mild hearing impairment on that side.  Per prior chart, was evaluated for same at [facility] yesterday, diagnosed w/ cellulitis of ear and d/c'd home w/ keflex, cortisporin otic and vicodin.  Has been compliant w/ abx but no improvement in sx and no relief w/ vicodin.   Past Medical History  Diagnosis Date  . Hypertension   . Cancer   . Cyst of fallopian tube 08/02/2013   Past Surgical History  Procedure Laterality Date  . Cervix removal      partial  . Tonsillectomy     History reviewed. No pertinent family history. History  Substance Use Topics  . Smoking status: Former Games developer  . Smokeless tobacco: Not on file  . Alcohol Use: Yes     Comment: socailly   OB History   Grav Para Term Preterm Abortions TAB SAB Ect Mult Living                 Review of Systems  All other systems reviewed and are negative.    Allergies  Bee venom  Home Medications   Current Outpatient Rx  Name  Route  Sig  Dispense  Refill  . ALPRAZolam (XANAX) 1 MG tablet   Oral   Take 1 mg by mouth 4 (four) times daily. For anxiety         . cephALEXin (KEFLEX) 500 MG capsule   Oral   Take 500 mg by mouth 4 (four) times daily. Take all of medicine and drink lots of fluids. Patient started medication on 09-16-13         . HYDROcodone-acetaminophen (NORCO/VICODIN) 5-325 MG per tablet   Oral   Take 1-2 tablets by mouth every 6 (six) hours as needed for pain.         Marland Kitchen neomycin-polymyxin-hydrocortisone  (CORTISPORIN) otic solution   Left Ear   Place 4 drops into the left ear 3 (three) times daily.          BP 112/68  Pulse 63  Temp(Src) 98.2 F (36.8 C) (Oral)  Resp 16  SpO2 98% Physical Exam  Nursing note and vitals reviewed. Constitutional: She is oriented to person, place, and time. She appears well-developed and well-nourished. No distress.  HENT:  Head: Normocephalic and atraumatic.  Mild, diffuse erythema L EAC.  No edema or drainage.  No abscess.  L TM opaque and bulging.  Pain with palpation of outer ear.  No edema/erythema/ttp mastoid.   Eyes:  Normal appearance  Neck: Normal range of motion.  Pulmonary/Chest: Effort normal.  Musculoskeletal: Normal range of motion.  Neurological: She is alert and oriented to person, place, and time.  Psychiatric: She has a normal mood and affect. Her behavior is normal.    ED Course  Procedures (including critical care time) Labs Review Labs Reviewed - No data to display Imaging Review No results found.  EKG Interpretation   None       MDM   1. Otalgia of left ear    37yo immocompetent F presents  for the second day in a row w/ L otalgia.  Was diagnosed w/ cellulitis of ear and prescribed keflex, cortisporin otic and vicodin at [facility] yesterday.  Compliant w/ meds but no improvement in sx and no relief w/ vicodin.  Based on prior chart, exam today seems to be improved.  Pt reassured and I explained that it may take several times for pain to resolve.  Will treat pain w/ percocet and auralgan today.  8:59 PM   Mild improvement in pain. Pt advised to f/u with her PCP and was referred to ENT as requested.  9:44 PM   Otilio Miu, PA-C 09/17/13 2144

## 2013-09-17 NOTE — ED Notes (Signed)
Presents with left ear pain and DX of "cellulitis of left ear canal at [facility]" reports "I can't sleep, it hurts to chew, it hurts to do everything and they need to lance this right away. Ear is red, unable to see in ear due to pt unable to sit still. Given RX for vicodin and cephalexin. "it needs to be lanced right away and I can feel the infection in there, there is a lot of pressure they only gave 10 pain pills and this thing is hurting so badly"

## 2013-09-17 NOTE — ED Notes (Addendum)
Schinlever PA at bedside. 

## 2013-09-17 NOTE — ED Notes (Signed)
Pt states understanding of discharge instructions 

## 2013-09-19 NOTE — ED Provider Notes (Signed)
Medical screening examination/treatment/procedure(s) were performed by non-physician practitioner and as supervising physician I was immediately available for consultation/collaboration.  EKG Interpretation   None         Suzi Roots, MD 09/19/13 [phone]

## 2013-09-25 ENCOUNTER — Ambulatory Visit: Payer: Medicaid Other | Admitting: Obstetrics

## 2013-09-25 ENCOUNTER — Ambulatory Visit (INDEPENDENT_AMBULATORY_CARE_PROVIDER_SITE_OTHER): Payer: Medicaid Other | Admitting: Obstetrics & Gynecology

## 2013-09-25 ENCOUNTER — Encounter: Payer: Self-pay | Admitting: Obstetrics & Gynecology

## 2013-09-25 VITALS — BP 109/71 | HR 69 | Temp 97.7°F | Ht 64.0 in | Wt 151.6 lb

## 2013-09-25 DIAGNOSIS — N926 Irregular menstruation, unspecified: Secondary | ICD-10-CM

## 2013-09-25 DIAGNOSIS — N939 Abnormal uterine and vaginal bleeding, unspecified: Secondary | ICD-10-CM

## 2013-09-25 DIAGNOSIS — N949 Unspecified condition associated with female genital organs and menstrual cycle: Secondary | ICD-10-CM

## 2013-09-25 DIAGNOSIS — G8929 Other chronic pain: Secondary | ICD-10-CM

## 2013-09-25 LAB — PROTIME-INR: Prothrombin Time: 12.1 seconds (ref 11.6–15.2)

## 2013-09-25 LAB — APTT: aPTT: 29 seconds (ref 24–37)

## 2013-09-25 NOTE — Progress Notes (Signed)
.   Subjective:     Alyssa Shaffer is a 37 y.o. female here for a problem exam.  Current complaints:patient states that she has left sided pelvic pain.   Patient states that she has a history of  Ovarian cysts. Personal health questionnaire reviewed: no.   Gynecologic History Patient's last menstrual period was 09/20/2013. Contraception: none Last Pap: 02/2012. Results were: normal Last mammogram: N/A  Obstetric History OB History  No data available     The following portions of the patient's history were reviewed and updated as appropriate: allergies, current medications, past family history, past medical history, past social history, past surgical history and problem list.  Review of Systems Pertinent items are noted in HPI.    Objective:      General appearance: alert Breasts: normal appearance, no masses or tenderness Abdomen: soft, non-tender; bowel sounds normal; no masses,  no organomegaly Pelvic: cervix normal in appearance, external genitalia normal, no adnexal masses or tenderness, uterus normal size, shape, and consistency and vagina thin, white discharge      Assessment:    Chronic pelvic pain ?h/o bleeding diathesis  Plan:   Orders Placed This Encounter  Procedures  . Urine culture  . WET PREP BY MOLECULAR PROBE  . GC/Chlamydia Probe Amp  . Von Willebrand multimeric  . INR/PT  . PTT  Offered a diagnostic laparoscopy--risks of surgery reviewed

## 2013-09-26 LAB — URINE CULTURE
Colony Count: NO GROWTH
Organism ID, Bacteria: NO GROWTH

## 2013-09-26 LAB — WET PREP BY MOLECULAR PROBE: Gardnerella vaginalis: POSITIVE — AB

## 2013-09-28 ENCOUNTER — Encounter: Payer: Self-pay | Admitting: Obstetrics & Gynecology

## 2013-09-28 DIAGNOSIS — G8929 Other chronic pain: Secondary | ICD-10-CM | POA: Insufficient documentation

## 2013-09-28 NOTE — Patient Instructions (Signed)
Diagnostic Laparoscopy Laparoscopy is a surgical procedure. It is used to diagnose and treat diseases inside the belly (abdomen). It is usually a brief, common, and relatively simple procedure. The laparoscopeis a thin, lighted, pencil-sized instrument. It is like a telescope. It is inserted into your abdomen through a small cut (incision). Your caregiver can look at the organs inside your body through this instrument. He or she can see if there is anything abnormal. Laparoscopy can be done either in a hospital or outpatient clinic. You may be given a mild sedative to help you relax before the procedure. Once in the operating room, you will be given a drug to make you sleep (general anesthesia). Laparoscopy usually lasts less than 1 hour. After the procedure, you will be monitored in a recovery area until you are stable and doing well. Once you are home, it will take 2 to 3 days to fully recover. RISKS AND COMPLICATIONS  Laparoscopy has relatively few risks. Your caregiver will discuss the risks with you before the procedure. Some problems that can occur include:  Infection.  Bleeding.  Damage to other organs.  Anesthetic side effects. PROCEDURE Once you receive anesthesia, your surgeon inflates the abdomen with a harmless gas (carbon dioxide). This makes the organs easier to see. The laparoscope is inserted into the abdomen through a small incision. This allows your surgeon to see into the abdomen. Other small instruments are also inserted into the abdomen through other small openings. Many surgeons attach a video camera to the laparoscope to enlarge the view. During a diagnostic laparoscopy, the surgeon may be looking for inflammation, infection, or cancer. Your surgeon may take tissue samples(biopsies). The samples are sent to a specialist in looking at cells and tissue samples (pathologist). The pathologist examines them under a microscope. Biopsies can help to diagnose or confirm a  disease. AFTER THE PROCEDURE   The gas is released from inside the abdomen.  The incisions are closed with stitches (sutures). Because these incisions are small (usually less than 1/2 inch), there is usually minimal discomfort after the procedure. There may be some mild discomfort in the throat. This is from the tube placed in the throat while you were sleeping. You may have some mild abdominal discomfort. There may also be discomfort from the instrument placement incisions in the abdomen.  The recovery time is shortened as long as there are no complications.  You will rest in a recovery room until stable and doing well. As long as there are no complications, you may be allowed to go home. FINDING OUT THE RESULTS OF YOUR TEST Not all test results are available during your visit. If your test results are not back during the visit, make an appointment with your caregiver to find out the results. Do not assume everything is normal if you have not heard from your caregiver or the medical facility. It is important for you to follow up on all of your test results. HOME CARE INSTRUCTIONS   Take all medicines as directed.  Only take over-the-counter or prescription medicines for pain, discomfort, or fever as directed by your caregiver.  Resume daily activities as directed.  Showers are preferred over baths.  You may resume sexual activities in 1 week or as directed.  Do not drive while taking narcotics. SEEK MEDICAL CARE IF:   There is increasing abdominal pain.  There is new pain in the shoulders (shoulder strap areas).  You feel lightheaded or faint.  You have the chills.  You or   your child has an oral temperature above 102 F (38.9 C).  There is pus-like (purulent) drainage from any of the wounds.  You are unable to pass gas or have a bowel movement.  You feel sick to your stomach (nauseous) or throw up (vomit). MAKE SURE YOU:   Understand these instructions.  Will watch  your condition.  Will get help right away if you are not doing well or get worse. Document Released: 02/13/2001 Document Revised: 03/04/2013 Document Reviewed: 11/07/2007 [facility] Patient Information 2014 [place], [place]. Pelvic Pain, Female Female pelvic pain can be caused by many different things and start from a variety of places. Pelvic pain refers to pain that is located in the lower half of the abdomen and between your hips. The pain may occur over a short period of time (acute) or may be reoccurring (chronic). The cause of pelvic pain may be related to disorders affecting the female reproductive organs (gynecologic), but it may also be related to the bladder, kidney stones, an intestinal complication, or muscle or skeletal problems. Getting help right away for pelvic pain is important, especially if there has been severe, sharp, or a sudden onset of unusual pain. It is also important to get help right away because some types of pelvic pain can be life threatening.  CAUSES  Below are only some of the causes of pelvic pain. The causes of pelvic pain can be in one of several categories.   Gynecologic.  Pelvic inflammatory disease.  Sexually transmitted infection.  Ovarian cyst or a twisted ovarian ligament (ovarian torsion).  Uterine lining that grows outside the uterus (endometriosis).  Fibroids, cysts, or tumors.  Ovulation.  Pregnancy.  Pregnancy that occurs outside the uterus (ectopic pregnancy).  Miscarriage.  Labor.  Abruption of the placenta or ruptured uterus.  Infection.  Uterine infection (endometritis).  Bladder infection.  Diverticulitis.  Miscarriage related to a uterine infection (septic abortion).  Bladder.  Inflammation of the bladder (cystitis).  Kidney stone(s).  Gastrointenstinal.  Constipation.  Diverticulitis.  Neurologic.  Trauma.  Feeling pelvic pain because of mental or emotional causes (psychosomatic).  Cancers of the bowel or  pelvis. EVALUATION  Your caregiver will want to take a careful history of your concerns. This includes recent changes in your health, a careful gynecologic history of your periods (menses), and a sexual history. Obtaining your family history and medical history is also important. Your caregiver may suggest a pelvic exam. A pelvic exam will help identify the location and severity of the pain. It also helps in the evaluation of which organ system may be involved. In order to identify the cause of the pelvic pain and be properly treated, your caregiver may order tests. These tests may include:   A pregnancy test.  Pelvic ultrasonography.  An X-ray exam of the abdomen.  A urinalysis or evaluation of vaginal discharge.  Blood tests. HOME CARE INSTRUCTIONS   Only take over-the-counter or prescription medicines for pain, discomfort, or fever as directed by your caregiver.   Rest as directed by your caregiver.   Eat a balanced diet.   Drink enough fluids to make your urine clear or pale yellow, or as directed.   Avoid sexual intercourse if it causes pain.   Apply warm or cold compresses to the lower abdomen depending on which one helps the pain.   Avoid stressful situations.   Keep a journal of your pelvic pain. Write down when it started, where the pain is located, and if there are things that  seem to be associated with the pain, such as food or your menstrual cycle.  Follow up with your caregiver as directed.  SEEK MEDICAL CARE IF:  Your medicine does not help your pain.  You have abnormal vaginal discharge. SEEK IMMEDIATE MEDICAL CARE IF:   You have heavy bleeding from the vagina.   Your pelvic pain increases.   You feel lightheaded or faint.   You have chills.   You have pain with urination or blood in your urine.   You have uncontrolled diarrhea or vomiting.   You have a fever or persistent symptoms for more than 3 days.  You have a fever and your  symptoms suddenly get worse.   You are being physically or sexually abused.  MAKE SURE YOU:  Understand these instructions.  Will watch your condition.  Will get help if you are not doing well or get worse. Document Released: 10/04/2004 Document Revised: 05/08/2012 Document Reviewed: 02/27/2012 [facility] Patient Information 2014 [place], [place].

## 2013-10-01 LAB — VON WILLEBRAND FACTOR MULTIMER
Factor-VIII Activity: 109 % (ref 50–180)
Von Willebrand Factor Ag: 137 % (ref 50–217)

## 2013-10-09 ENCOUNTER — Ambulatory Visit: Payer: Medicaid Other | Admitting: Obstetrics & Gynecology

## 2013-10-11 ENCOUNTER — Encounter (HOSPITAL_COMMUNITY): Payer: Self-pay | Admitting: Emergency Medicine

## 2013-10-11 ENCOUNTER — Emergency Department (HOSPITAL_COMMUNITY)
Admission: EM | Admit: 2013-10-11 | Discharge: 2013-10-11 | Disposition: A | Discharge status: Home / Self Care | Payer: Medicaid Other | Attending: Emergency Medicine | Admitting: Emergency Medicine

## 2013-10-11 DIAGNOSIS — M545 Low back pain, unspecified: Secondary | ICD-10-CM | POA: Insufficient documentation

## 2013-10-11 DIAGNOSIS — Z79899 Other long term (current) drug therapy: Secondary | ICD-10-CM | POA: Insufficient documentation

## 2013-10-11 DIAGNOSIS — I10 Essential (primary) hypertension: Secondary | ICD-10-CM | POA: Insufficient documentation

## 2013-10-11 DIAGNOSIS — R109 Unspecified abdominal pain: Secondary | ICD-10-CM | POA: Insufficient documentation

## 2013-10-11 DIAGNOSIS — Z859 Personal history of malignant neoplasm, unspecified: Secondary | ICD-10-CM | POA: Insufficient documentation

## 2013-10-11 DIAGNOSIS — Z3202 Encounter for pregnancy test, result negative: Secondary | ICD-10-CM | POA: Insufficient documentation

## 2013-10-11 DIAGNOSIS — Z8742 Personal history of other diseases of the female genital tract: Secondary | ICD-10-CM | POA: Insufficient documentation

## 2013-10-11 DIAGNOSIS — F17200 Nicotine dependence, unspecified, uncomplicated: Secondary | ICD-10-CM | POA: Insufficient documentation

## 2013-10-11 DIAGNOSIS — G8929 Other chronic pain: Secondary | ICD-10-CM | POA: Insufficient documentation

## 2013-10-11 LAB — URINALYSIS, DIPSTICK ONLY
Bilirubin Urine: NEGATIVE
Leukocytes, UA: NEGATIVE
Nitrite: NEGATIVE
Specific Gravity, Urine: 1.012 (ref 1.005–1.030)
Urobilinogen, UA: 0.2 mg/dL (ref 0.0–1.0)
pH: 6 (ref 5.0–8.0)

## 2013-10-11 LAB — COMPREHENSIVE METABOLIC PANEL
ALT: 10 U/L (ref 0–35)
Alkaline Phosphatase: 61 U/L (ref 39–117)
BUN: 9 mg/dL (ref 6–23)
CO2: 25 mEq/L (ref 19–32)
GFR calc Af Amer: >90 mL/min (ref >90)
GFR calc non Af Amer: >90 mL/min (ref >90)
Glucose, Bld: 89 mg/dL (ref 70–99)
Potassium: 4.6 mEq/L (ref 3.5–5.1)
Sodium: 138 mEq/L (ref 135–145)
Total Bilirubin: 0.5 mg/dL (ref 0.3–1.2)

## 2013-10-11 LAB — CBC
HCT: 40.4 % (ref 36.0–46.0)
MCHC: 34.7 g/dL (ref 30.0–36.0)
Platelets: 117 10*3/uL — ABNORMAL LOW (ref 150–400)
RDW: 13.6 % (ref 11.5–15.5)
WBC: 6.1 10*3/uL (ref 4.0–10.5)

## 2013-10-11 LAB — LIPASE, BLOOD: Lipase: 25 U/L (ref 11–59)

## 2013-10-11 LAB — RAPID URINE DRUG SCREEN, HOSP PERFORMED
Cocaine: NOT DETECTED
Opiates: NOT DETECTED
Tetrahydrocannabinol: NOT DETECTED

## 2013-10-11 MED ORDER — KETOROLAC TROMETHAMINE 60 MG/2ML IM SOLN
60.0000 mg | Freq: Once | INTRAMUSCULAR | Status: AC
  Administered 2013-10-11: 60 mg via INTRAMUSCULAR
  Filled 2013-10-11: qty 2

## 2013-10-11 MED ORDER — TRAMADOL HCL 50 MG PO TABS: 50.0000 mg | ORAL_TABLET | Freq: Four times a day (QID) | ORAL | Status: DC | PRN

## 2013-10-11 NOTE — ED Notes (Signed)
Pt upset with treatment plan and wanting more pain medication.

## 2013-10-11 NOTE — ED Notes (Signed)
Per pt sts here for chronic abdominal pain. sts hasn't had medication in 1 month. sts suppose to be getting into a pain clinic.

## 2013-10-11 NOTE — ED Notes (Signed)
Pt for discharge but pt left the unit without informing.Pt unhappy that she did not get enough pain medication.

## 2013-10-11 NOTE — ED Provider Notes (Signed)
CSN: [id number]     Arrival date & time 10/11/13  1138 History   First MD Initiated Contact with Patient 10/11/13 1255     Chief Complaint  Patient presents with  . Abdominal Pain   (Consider location/radiation/quality/duration/timing/severity/associated sxs/prior Treatment) Patient is a 37 y.o. female presenting with abdominal pain.  Abdominal Pain Associated symptoms: no chest pain, no chills, no cough, no diarrhea, no dysuria, no fever, no nausea, no shortness of breath, no sore throat and no vomiting     37 year old female here with exacerbation of chronic abdominal pain for the last 3 weeks. She states that she's been out of her usual Norco as she is "between doctors". She states that her pain is constant 10 out of 10 sharp pain in the left lower quadrant radiating down her left thigh and up her left flank. It aggravated by walking or moving and there are no alleviating factors except for narcotic pain medications. Denies nausea, vomiting, constipation, fevers, chills, sweats.  Past Medical History  Diagnosis Date  . Hypertension   . Cancer   . Cyst of fallopian tube 08/02/2013   Past Surgical History  Procedure Laterality Date  . Cervix removal      partial  . Tonsillectomy    . Tubal ligation     Family History  Problem Relation Age of Onset  . Heart disease Mother   . Seizures Mother   . Hypertension Father   . Heart disease Father    History  Substance Use Topics  . Smoking status: Current Every Day Smoker -- 1.00 packs/day for 27 years  . Smokeless tobacco: Not on file  . Alcohol Use: Yes     Comment: socailly   OB History   Grav Para Term Preterm Abortions TAB SAB Ect Mult Living                 Review of Systems  Constitutional: Negative for fever, chills and appetite change.  HENT: Negative for sore throat.   Respiratory: Negative for cough and shortness of breath.   Cardiovascular: Negative for chest pain.  Gastrointestinal: Positive for abdominal  pain. Negative for nausea, vomiting and diarrhea.  Genitourinary: Negative for dysuria and urgency.  Neurological: Negative for headaches.  All other systems reviewed and are negative.    Allergies  Bee venom  Home Medications   Current Outpatient Rx  Name  Route  Sig  Dispense  Refill  . ALPRAZolam (XANAX) 1 MG tablet   Oral   Take 1 mg by mouth at bedtime as needed for anxiety.          Marland Kitchen HYDROcodone-acetaminophen (NORCO/VICODIN) 5-325 MG per tablet   Oral   Take 1 tablet by mouth every 6 (six) hours as needed for moderate pain.          . traMADol (ULTRAM) 50 MG tablet   Oral   Take 1 tablet (50 mg total) by mouth every 6 (six) hours as needed.   30 tablet   0    BP 103/68  Pulse 58  Temp(Src) 97.7 F (36.5 C) (Oral)  Resp 18  Ht 5\' 3"  (1.6 m)  Wt 142 lb (64.411 kg)  BMI 25.16 kg/m2  SpO2 98%  LMP 09/20/2013 Physical Exam  Constitutional: She is oriented to person, place, and time. She appears well-developed and well-nourished. She appears distressed.  HENT:  Head: Normocephalic and atraumatic.  Eyes: EOM are normal. Pupils are equal, round, and reactive to light.  Neck: Neck supple.  Cardiovascular: Normal rate, regular rhythm and normal heart sounds.   No murmur heard. Pulmonary/Chest: Effort normal and breath sounds normal.  Abdominal: Soft. Bowel sounds are normal. There is tenderness in the right lower quadrant, left upper quadrant and left lower quadrant. There is guarding. There is no rigidity, no rebound, no CVA tenderness and negative Murphy's sign.  Musculoskeletal: She exhibits no edema.  Soft tissue tenderness on R side of lumbar spine  Neurological: She is alert and oriented to person, place, and time.  Skin: Skin is warm and dry. She is not diaphoretic.  Psychiatric: She has a normal mood and affect.    ED Course  Procedures (including critical care time) Labs Review Labs Reviewed  CBC - Abnormal; Notable for the following:     Platelets 117 (*)    All other components within normal limits  COMPREHENSIVE METABOLIC PANEL  LIPASE, BLOOD  URINALYSIS, DIPSTICK ONLY  PREGNANCY, URINE  URINE RAPID DRUG SCREEN (HOSP PERFORMED)   Imaging Review No results found.  EKG Interpretation   None       MDM   1. Chronic pain     37 year old female here with exacerbation of chronic abdominal pain after running out of narcotics at home. She states that she is between doctors and so cannot get another prescription. She's recently been seen by OB/GYN about 3 weeks ago they offered exploratory lap to evaluate for structural abnormality.   Discussed that OB/GYN has offered surgery to the patient and appears that she does understand that and is not interested in it.   After UDS and urine were collected she admitted to taking percocet last night.   Offered Rx of tramadol, she declined as she states that it does not help.  Red flags were given, return for worsening symptoms.  Encouraged establishing PCP and follow up with GYN  Murtis Sink, MD [facility] Family Medicine Resident, PGY-2 10/11/2013, 3:48 PM      Elenora Gamma, MD 10/11/13 [phone]

## 2013-10-19 NOTE — ED Provider Notes (Signed)
I saw and evaluated the patient, reviewed the resident's note and I agree with the findings and plan.   .Face to face Exam:  General:  Awake HEENT:  Atraumatic Resp:  Normal effort Abd:  Nondistended Neuro:No focal weakness  Nelia Shi, MD 10/19/13 [phone]

## 2013-11-05 ENCOUNTER — Other Ambulatory Visit: Payer: Self-pay | Admitting: *Deleted

## 2013-11-05 DIAGNOSIS — B9689 Other specified bacterial agents as the cause of diseases classified elsewhere: Secondary | ICD-10-CM

## 2013-11-05 MED ORDER — METRONIDAZOLE 500 MG PO TABS: 500.0000 mg | ORAL_TABLET | Freq: Two times a day (BID) | ORAL | Status: DC

## 2014-04-09 ENCOUNTER — Encounter (HOSPITAL_COMMUNITY): Payer: Self-pay | Admitting: Emergency Medicine

## 2014-04-09 ENCOUNTER — Emergency Department (HOSPITAL_COMMUNITY)
Admission: EM | Admit: 2014-04-09 | Discharge: 2014-04-09 | Disposition: A | Discharge status: Home / Self Care | Payer: Medicaid Other | Attending: Emergency Medicine | Admitting: Emergency Medicine

## 2014-04-09 DIAGNOSIS — K0889 Other specified disorders of teeth and supporting structures: Secondary | ICD-10-CM

## 2014-04-09 DIAGNOSIS — K029 Dental caries, unspecified: Secondary | ICD-10-CM | POA: Insufficient documentation

## 2014-04-09 DIAGNOSIS — F17200 Nicotine dependence, unspecified, uncomplicated: Secondary | ICD-10-CM | POA: Insufficient documentation

## 2014-04-09 DIAGNOSIS — Z8742 Personal history of other diseases of the female genital tract: Secondary | ICD-10-CM | POA: Insufficient documentation

## 2014-04-09 DIAGNOSIS — Z79899 Other long term (current) drug therapy: Secondary | ICD-10-CM | POA: Insufficient documentation

## 2014-04-09 DIAGNOSIS — K089 Disorder of teeth and supporting structures, unspecified: Secondary | ICD-10-CM | POA: Insufficient documentation

## 2014-04-09 DIAGNOSIS — I10 Essential (primary) hypertension: Secondary | ICD-10-CM | POA: Insufficient documentation

## 2014-04-09 DIAGNOSIS — Z859 Personal history of malignant neoplasm, unspecified: Secondary | ICD-10-CM | POA: Insufficient documentation

## 2014-04-09 MED ORDER — AMOXICILLIN 500 MG PO TABS: 500.0000 mg | ORAL_TABLET | Freq: Two times a day (BID) | ORAL | Status: DC

## 2014-04-09 MED ORDER — OXYCODONE-ACETAMINOPHEN 5-325 MG PO TABS: 1.0000 | ORAL_TABLET | ORAL | Status: DC | PRN

## 2014-04-09 MED ORDER — OXYCODONE-ACETAMINOPHEN 5-325 MG PO TABS
2.0000 | ORAL_TABLET | Freq: Once | ORAL | Status: AC
  Administered 2014-04-09: 2 via ORAL
  Filled 2014-04-09: qty 2

## 2014-04-09 NOTE — ED Notes (Signed)
Presents with dental pain began Sunday pain is severe. Having difficulty chewing and eating food. Tooth decay noted and multiple dental caries.

## 2014-04-09 NOTE — ED Provider Notes (Signed)
CSN: [id number]     Arrival date & time 04/09/14  2037 History   This chart was scribed for non-physician practitioner working with Tanna Furry, MD, by Marcha Dutton ED Scribe. This patient was seen in room TR11C/TR11C and the patient's care was started at 9:51 PM.    Chief Complaint  Patient presents with  . Dental Pain    Patient is a 38 y.o. female presenting with tooth pain. The history is provided by the patient. No language interpreter was used.  Dental Pain Quality:  Aching, sharp and constant Severity:  Severe Onset quality:  Gradual Duration:  3 days Timing:  Constant Progression:  Worsening Chronicity:  New Context: dental caries and recent dental surgery   Context: not abscess, cap still on, not crown fracture and not trauma   Relieved by:  Nothing Worsened by:  Hot food/drink, cold food/drink, touching, jaw movement and pressure Ineffective treatments:  Acetaminophen and NSAIDs Associated symptoms: facial pain   Associated symptoms: no congestion, no difficulty swallowing, no drooling, no facial swelling, no fever, no gum swelling, no headaches, no neck pain, no neck swelling, no oral bleeding, no oral lesions and no trismus   Risk factors: smoking   Risk factors: no diabetes and sufficient dental care     HPI Comments: Alyssa Shaffer is a 38 y.o. female who presents to the Emergency Department complaining of dental pain that began 3 days ago. She states her pain is severe and she took advil without significant improvement. Pt reports eating exacerbates her pain. She states she does have a dentist but has not been in to see them recently.   Past Medical History  Diagnosis Date  . Hypertension   . Cancer   . Cyst of fallopian tube 08/02/2013    Past Surgical History  Procedure Laterality Date  . Cervix removal      partial  . Tonsillectomy    . Tubal ligation      Family History  Problem Relation Age of Onset  . Heart disease Mother   .  Seizures Mother   . Hypertension Father   . Heart disease Father     History  Substance Use Topics  . Smoking status: Current Every Day Smoker -- 1.00 packs/day for 27 years  . Smokeless tobacco: Not on file  . Alcohol Use: Yes     Comment: socailly    OB History   Grav Para Term Preterm Abortions TAB SAB Ect Mult Living                  Review of Systems  Constitutional: Negative for fever.  HENT: Negative for congestion, drooling, facial swelling and mouth sores.   Musculoskeletal: Negative for neck pain.  Neurological: Negative for headaches.    Allergies  Bee venom  Home Medications   Prior to Admission medications   Medication Sig Start Date End Date Taking? Authorizing Provider  ALPRAZolam Duanne Moron) 1 MG tablet Take 1 mg by mouth at bedtime as needed for anxiety.     Historical Provider, MD  HYDROcodone-acetaminophen (NORCO/VICODIN) 5-325 MG per tablet Take 1 tablet by mouth every 6 (six) hours as needed for moderate pain.     Historical Provider, MD  metroNIDAZOLE (FLAGYL) 500 MG tablet Take 1 tablet (500 mg total) by mouth 2 (two) times daily. 11/05/13   Lahoma Crocker, MD  traMADol (ULTRAM) 50 MG tablet Take 1 tablet (50 mg total) by mouth every 6 (six) hours as needed. 10/11/13  Timmothy Euler, MD    Triage Vitals: BP 135/95  Pulse 85  Temp(Src) 98.4 F (36.9 C) (Oral)  Resp 18  Ht 5\' 3"  (1.6 m)  Wt 145 lb (65.772 kg)  BMI 25.69 kg/m2  SpO2 97%  LMP 04/01/2014   Physical Exam  Nursing note and vitals reviewed. Constitutional: She is oriented to person, place, and time. She appears well-developed and well-nourished. No distress.  Awake, alert, nontoxic appearance.  HENT:  Head: Normocephalic and atraumatic.  Mouth/Throat: Oropharynx is clear and moist. Dental caries present. No oropharyngeal exudate.  Rt upper 2nd and 3rd molars, 3rd molar missing crown, carries in the 2nd molar. No gingival erythema or swelling.   Eyes: Right eye exhibits no  discharge. Left eye exhibits no discharge.  Neck: Neck supple.  Pulmonary/Chest: Effort normal. She exhibits no tenderness.  Abdominal: Soft. There is no tenderness. There is no rebound.  Musculoskeletal: She exhibits no tenderness.  Baseline ROM, no obvious new focal weakness.  Neurological: She is alert and oriented to person, place, and time.  Mental status and motor strength appears baseline for patient and situation.  Skin: Skin is warm and dry. No rash noted. She is not diaphoretic.  Psychiatric: She has a normal mood and affect. Her behavior is normal.    ED Course  Procedures (including critical care time)   DIAGNOSTIC STUDIES: Oxygen Saturation is 97% on RA, normal by my interpretation.     COORDINATION OF CARE: 10:03 PM- Pt advised of plan for treatment and pt agrees.   Labs Review Labs Reviewed - No data to display   Imaging Review No results found.    EKG Interpretation None      MDM   Final diagnoses:  Pain, dental    Patient with toothache.  No gross abscess.  Exam unconcerning for Ludwig's angina or spread of infection.  Will treat with penicillin and pain medicine.  Urged patient to follow-up with dentist.     I personally performed the services described in this documentation, which was scribed in my presence. The recorded information has been reviewed and is accurate.    Margarita Mail, PA-C 04/11/14 1121

## 2014-04-09 NOTE — Discharge Instructions (Signed)
You have been diagnosed with Dental pain. Please call the follow up dentist first thing in the morning on Monday for a follow up appointment. Keep your discharge paperwork from today's visit to bring to the dentist office. You may also use the resource guide listed below to help you find a dentist if you do not already have one to followup with. It is very important that you get evaluated by a dentist as soon as possible.  Use your pain medication as prescribed and do not operate heavy machinery while on pain medication. Note that your pain medication contains acetaminophen (Tylenol) & its is not reccommended that you use additional acetaminophen (Tylenol) while taking this medication. Take your full course of antibiotics. Read the instructions below. ° °Eat a soft or liquid diet and rinse your mouth out after meals with warm water. You should see a dentist or return here at once if you have increased swelling, increased pain or uncontrolled bleeding from the site of your injury. ° ° °SEEK MEDICAL CARE IF:  °· You have increased pain not controlled with medicines.  °· You have swelling around your tooth, in your face or neck.  °· You have bleeding which starts, continues, or gets worse.  °· You have a fever >101 °· If you are unable to open your mouth °Soft Diet  °The soft diet may be recommended after you were put on a full liquid diet. A normal diet may follow. The soft diet can also be used after surgery if you are too ill to keep down a normal diet. The soft diet may also be needed if you have a hard time chewing foods.  °DESCRIPTION  °Tender foods are used. Foods do not need to be ground or pureed. Most raw fruits and vegetables and coarse breads and cereals should be avoided. Fried foods and highly seasoned foods may cause discomfort.  °NUTRITIONAL ADEQUACY  °A healthy diet is possible if foods from each of the basic food groups are eaten daily.  °SOFT DIET FOOD LISTS  °Milk/Dairy  °Allowed: Milk and milk  drinks, milk shakes, cream cheese, cottage cheese, mild cheeses.  °Avoid: Sharp or highly seasoned cheese. °Meat/Meat Substitutes  °Allowed: Broiled, roasted, baked, or stewed tender lean beef, mutton, lamb, veal, chicken, turkey, liver, ham, crisp bacon, white fish, tuna, salmon. Eggs, smooth peanut butter.  °Avoid: All fried meats, fish, or fowl. Rich gravies and sauces. Lunch meats, sausages, hot dogs. Meats with gristle, chunky peanut butter. °Breads/Grains  °Allowed: Rice, noodles, spaghetti, macaroni. Dry or cooked refined cereals, such as farina, cream of wheat, oatmeal, grits, whole-wheat cereals. Plain or toasted white or wheat blend or whole-grain breads, soda crackers or saltines, flour tortillas.  °Avoid: Wild rice, coarse cereals, such as bran. Seed in or on breads and crackers. Bread or bread products with nuts or seeds. °Fruits/Vegetables  °Allowed: Fruit and vegetable juices, well-cooked or canned fruits and vegetables, any dried fruit. One citrus fruit daily, 1 vitamin A source daily. Well-ripened, easy to chew fruits, sweet potatoes. Baked, boiled, mashed, creamed, scalloped, or au gratin potatoes. Broths or creamed soups made with allowed vegetables, strained tomatoes.  °Avoid: All gas-forming vegetables (corn, radishes, Brussels sprouts, onions, broccoli, cabbage, parsnips, turnips, chili peppers, pinto beans, split peas, dried beans). Fruits containing seeds and skin. Potato chips and corn chips. All others that are not made with allowed vegetables. Highly seasoned soups. °Desserts/Sweets  °Allowed: Simple desserts, such as custard, junkets, gelatin desserts, plain ice cream and sherbets, simple cakes   and cookies, allowed fruits, sugar, syrup, jelly, honey, plain hard candy, and molasses.  °Avoid: Rich pastries, any dessert containing dates, nuts, raisins, or coconut. Fried pastries, such as doughnuts. Chocolate. °Beverages  °Allowed: Fruit and vegetable juices. Caffeine-free carbonated drinks,  coffee, and tea.  °Avoid: Caffeinated beverages: coffee, tea, soda or pop. °Miscellaneous  °Allowed: Butter, cream, margarine, mayonnaise, oil. Cream sauces, salt, and mild spices.  °Avoid: Highly spiced salad dressings. Highly seasoned foods, hot sauce, mustard, horseradish, and pepper. °SAMPLE MENU  °Breakfast  °Orange juice.  °Oatmeal.  °Soft cooked egg.  °Toast and margarine.  °2% milk.  °Coffee. °Lunch  °Meatloaf.  °Mashed potato.  °Green beans.  °Lemon pudding.  °Bread and margarine.  °Coffee. °Dinner  °Consommé or apricot nectar.  °Chicken breast.  °Rice, peas, and carrots.  °Applesauce.  °Bread and margarine.  °2% milk. °To cut the amount of fat in your diet, omit margarine and use 1% or skim milk.  °NUTRIENT ANALYSIS  °Calories........................1953 Kcal.  °Protein.........................102 gm.  °Carbohydrate...............247 gm.  °Fat................................65 gm.  °Cholesterol...................449 mg.  °Dietary fiber.................19 gm.  °Vitamin A.....................2944 RE.  °Vitamin C.....................79 mg.  °Niacin..........................25 mg.  °Riboflavin....................2.0 mg.  °Thiamin.......................1.5 mg.  °Folate..........................249 mcg.  °Calcium.......................1030 mg.  °Phosphorus.................1782 mg.  °Zinc..............................12 mg.  °Iron..............................13 mg.  °Sodium.........................299 mg.  °Potassium....................3046 mg. °Document Released: 02/14/2008 Document Revised: 01/30/2012 Document Reviewed: 02/14/2008  °ExitCare® Patient Information ©2014 [employer].  ° °RESOURCE GUIDE ° ° °Dental Problems ° °Dr. Janna Civilis °$200 dollar visit °[address] °[place], [place] [address]  °[phone] °  ° °Patients with Medicaid: °[facility]                      Dental °[address]                                           [address] °Phone:   [phone]                                                  Phone:  [phone] ° °If unable to pay or uninsured, contact:  [facility] or [facility] to become qualified for the adult dental clinic. ° °Chronic Pain Problems °Contact [facility]  [phone] °Patients need to be referred by their primary care doctor. ° °Insufficient Money for Medicine °Contact [employer]:  call "211" or [facility] 271-5999. ° °No Primary Care Doctor °Call [facility]  832-8000 °Other agencies that provide inexpensive medical care °   [facility]  [phone] °   [facility]  [phone] °   [facility]  [phone] °   [facility]  [phone] °   [facility]  [phone] °   [facility]  [phone] ° °Psychological Services °[facility]  [phone] °[facility]  [phone] °[facility]   [phone] (emergency services [phone]) ° °Substance Abuse Resources °[facility]  336-882-2125 °[facility] 336-784-9470 °[facility] 336-285-9073 °[facility] 336-845-3988 °Residential & Outpatient Substance Abuse Program  800-659-3381 ° °Abuse/Neglect °[place] [facility] (336) 641-3795 °[place] [facility] Hotline 800-378-5315 (After Hours) ° °Emergency Shelter °[facility] (336) 271-5985 ° °Maternity Homes °[facility] (336) 275-9566 °[facility] (704) 372-4663 ° °MRSA Hotline #:   832-7006 ° ° ° °[place] [facility] ° °[facility] [facility]     [employer]                          [facility] °[address] [place]                       [address]      [address]  °[place]                                                [place]                            [place] °Phone:  349-3220                                   Phone:  [phone]                 Phone:   [phone] ° °[facility] °Phone:  342-8316 ° °[place] [facility] Abuse Hotline °(336) 342-1394 °(336) 342-3537 (After Hours) ° ° ° ° ° ° °

## 2014-04-13 NOTE — ED Provider Notes (Signed)
Medical screening examination/treatment/procedure(s) were performed by non-physician practitioner and as supervising physician I was immediately available for consultation/collaboration.   EKG Interpretation None        Tanna Furry, MD 04/13/14 [phone]

## 2014-04-15 ENCOUNTER — Telehealth (HOSPITAL_BASED_OUTPATIENT_CLINIC_OR_DEPARTMENT_OTHER): Payer: Self-pay

## 2014-10-03 ENCOUNTER — Encounter (HOSPITAL_COMMUNITY): Payer: Self-pay | Admitting: *Deleted

## 2014-10-03 ENCOUNTER — Emergency Department (HOSPITAL_COMMUNITY)
Admission: EM | Admit: 2014-10-03 | Discharge: 2014-10-03 | Disposition: A | Discharge status: Home / Self Care | Payer: Medicaid Other | Attending: Emergency Medicine | Admitting: Emergency Medicine

## 2014-10-03 DIAGNOSIS — Z79891 Long term (current) use of opiate analgesic: Secondary | ICD-10-CM | POA: Diagnosis not present

## 2014-10-03 DIAGNOSIS — H9211 Otorrhea, right ear: Secondary | ICD-10-CM | POA: Diagnosis not present

## 2014-10-03 DIAGNOSIS — Z791 Long term (current) use of non-steroidal anti-inflammatories (NSAID): Secondary | ICD-10-CM | POA: Insufficient documentation

## 2014-10-03 DIAGNOSIS — Z720 Tobacco use: Secondary | ICD-10-CM | POA: Insufficient documentation

## 2014-10-03 DIAGNOSIS — H9201 Otalgia, right ear: Secondary | ICD-10-CM

## 2014-10-03 DIAGNOSIS — Z8742 Personal history of other diseases of the female genital tract: Secondary | ICD-10-CM | POA: Insufficient documentation

## 2014-10-03 DIAGNOSIS — Z79899 Other long term (current) drug therapy: Secondary | ICD-10-CM | POA: Insufficient documentation

## 2014-10-03 DIAGNOSIS — Z859 Personal history of malignant neoplasm, unspecified: Secondary | ICD-10-CM | POA: Diagnosis not present

## 2014-10-03 DIAGNOSIS — R1110 Vomiting, unspecified: Secondary | ICD-10-CM | POA: Diagnosis not present

## 2014-10-03 DIAGNOSIS — I10 Essential (primary) hypertension: Secondary | ICD-10-CM | POA: Diagnosis not present

## 2014-10-03 DIAGNOSIS — R51 Headache: Secondary | ICD-10-CM | POA: Insufficient documentation

## 2014-10-03 MED ORDER — OXYCODONE-ACETAMINOPHEN 5-325 MG PO TABS: 1.0000 | ORAL_TABLET | Freq: Three times a day (TID) | ORAL | Status: DC | PRN

## 2014-10-03 MED ORDER — IBUPROFEN 800 MG PO TABS: 800.0000 mg | ORAL_TABLET | Freq: Three times a day (TID) | ORAL | Status: DC

## 2014-10-03 MED ORDER — ONDANSETRON 4 MG PO TBDP: 4.0000 mg | ORAL_TABLET | Freq: Three times a day (TID) | ORAL | Status: DC | PRN

## 2014-10-03 MED ORDER — ONDANSETRON 4 MG PO TBDP
8.0000 mg | ORAL_TABLET | Freq: Once | ORAL | Status: AC
  Administered 2014-10-03: 8 mg via ORAL
  Filled 2014-10-03: qty 2

## 2014-10-03 MED ORDER — IBUPROFEN 800 MG PO TABS
800.0000 mg | ORAL_TABLET | Freq: Once | ORAL | Status: AC
  Administered 2014-10-03: 800 mg via ORAL
  Filled 2014-10-03: qty 1

## 2014-10-03 MED ORDER — ANTIPYRINE-BENZOCAINE 5.4-1.4 % OT SOLN
3.0000 [drp] | OTIC | Status: AC | PRN
  Administered 2014-10-03: 3 [drp] via OTIC
  Filled 2014-10-03: qty 10

## 2014-10-03 MED ORDER — CIPROFLOXACIN-DEXAMETHASONE 0.3-0.1 % OT SUSP
4.0000 [drp] | Freq: Two times a day (BID) | OTIC | Status: DC
Start: 2014-10-03 — End: 2016-07-29

## 2014-10-03 MED ORDER — OXYCODONE-ACETAMINOPHEN 5-325 MG PO TABS
2.0000 | ORAL_TABLET | Freq: Once | ORAL | Status: AC
  Administered 2014-10-03: 2 via ORAL
  Filled 2014-10-03: qty 2

## 2014-10-03 NOTE — ED Provider Notes (Signed)
CSN: [id number]     Arrival date & time 10/03/14  1703 History   First MD Initiated Contact with Patient 10/03/14 2029     Chief Complaint  Patient presents with  . Otalgia  . Emesis     (Consider location/radiation/quality/duration/timing/severity/associated sxs/prior Treatment) HPI Comments: Patient is a 38 year old female past medical history significant for hypertension presenting to the emergency department for severe right otalgia with radiation to her head and neck over the last 3 days. She states she has had some drainage from the ear today. She states she had 2 episodes of nonbloody nonbilious emesis due to increased pain. She denies any associated fevers, chills, chest pain, shortness of breath, cough, nasal congestion, rhinorrhea. She states she had something similar one year ago and Dr. Janace Hoard of [facility] and he had to drain something out of her ear, she cannot recover what exactly. She has not tried any medications at home for her pain.  Patient is a 38 y.o. female presenting with ear pain and vomiting.  Otalgia Associated symptoms: ear discharge, headaches and vomiting   Emesis Associated symptoms: headaches     Past Medical History  Diagnosis Date  . Hypertension   . Cancer   . Cyst of fallopian tube 08/02/2013   Past Surgical History  Procedure Laterality Date  . Cervix removal      partial  . Tonsillectomy    . Tubal ligation     Family History  Problem Relation Age of Onset  . Heart disease Mother   . Seizures Mother   . Hypertension Father   . Heart disease Father    History  Substance Use Topics  . Smoking status: Current Every Day Smoker -- 1.00 packs/day for 27 years  . Smokeless tobacco: Not on file  . Alcohol Use: Yes     Comment: socailly   OB History    No data available     Review of Systems  HENT: Positive for ear discharge and ear pain.   Gastrointestinal: Positive for vomiting.  Neurological: Positive for headaches.  All other  systems reviewed and are negative.     Allergies  Bee venom  Home Medications   Prior to Admission medications   Medication Sig Start Date End Date Taking? Authorizing Provider  ALPRAZolam Duanne Moron) 1 MG tablet Take 1 mg by mouth at bedtime as needed for anxiety.    Yes Historical Provider, MD  amoxicillin (AMOXIL) 500 MG tablet Take 1 tablet (500 mg total) by mouth 2 (two) times daily. Patient not taking: Reported on 10/03/2014 04/09/14   Margarita Mail, PA-C  ciprofloxacin-dexamethasone [facility]) otic suspension Place 4 drops into the right ear 2 (two) times daily. X 7 days 10/03/14   Stephani Police Audrionna Lampton, PA-C  ibuprofen (ADVIL,MOTRIN) 800 MG tablet Take 1 tablet (800 mg total) by mouth 3 (three) times daily. 10/03/14   Marchele Decock L Mayia Megill, PA-C  metroNIDAZOLE (FLAGYL) 500 MG tablet Take 1 tablet (500 mg total) by mouth 2 (two) times daily. Patient not taking: Reported on 10/03/2014 11/05/13   Lahoma Crocker, MD  ondansetron (ZOFRAN ODT) 4 MG disintegrating tablet Take 1 tablet (4 mg total) by mouth every 8 (eight) hours as needed for nausea or vomiting. 10/03/14   Sherhonda Gaspar L Atziry Baranski, PA-C  oxyCODONE-acetaminophen (PERCOCET) 5-325 MG per tablet Take 1-2 tablets by mouth every 4 (four) hours as needed. Patient not taking: Reported on 10/03/2014 04/09/14   Margarita Mail, PA-C  oxyCODONE-acetaminophen (PERCOCET/ROXICET) 5-325 MG per tablet Take 1 tablet by mouth  every 8 (eight) hours as needed for severe pain. May take 2 tablets PO q 6 hours for severe pain - Do not take with Tylenol as this tablet already contains tylenol 10/03/14   Iyanna Drummer L Zillah Alexie, PA-C  traMADol (ULTRAM) 50 MG tablet Take 1 tablet (50 mg total) by mouth every 6 (six) hours as needed. Patient not taking: Reported on 10/03/2014 10/11/13   Timmothy Euler, MD   BP 115/77 mmHg  Pulse 66  Temp(Src) 98.1 F (36.7 C) (Oral)  Resp 22  SpO2 96%  LMP 09/17/2014 Physical Exam  Constitutional: She is  oriented to person, place, and time. She appears well-developed and well-nourished. No distress.  HENT:  Head: Normocephalic and atraumatic.  Right Ear: Hearing, tympanic membrane and external ear normal. No mastoid tenderness. Tympanic membrane is not injected, not scarred, not perforated, not erythematous, not retracted and not bulging. No hemotympanum.  Left Ear: Hearing, tympanic membrane, external ear and ear canal normal. No mastoid tenderness. Tympanic membrane is not injected, not scarred, not perforated, not erythematous, not retracted and not bulging.  Ears:  Nose: Nose normal.  Mouth/Throat: Oropharynx is clear and moist. No oropharyngeal exudate.  Eyes: Conjunctivae and EOM are normal. Pupils are equal, round, and reactive to light.  Neck: Normal range of motion. Neck supple.  Cardiovascular: Normal rate, regular rhythm and normal heart sounds.   Pulmonary/Chest: Effort normal and breath sounds normal.  Abdominal: Soft. There is no tenderness.  Musculoskeletal: Normal range of motion.  Lymphadenopathy:    She has no cervical adenopathy.  Neurological: She is alert and oriented to person, place, and time.  Skin: Skin is warm and dry. She is not diaphoretic.  Psychiatric: She has a normal mood and affect.  Nursing note and vitals reviewed.   ED Course  Procedures (including critical care time) Medications  ondansetron (ZOFRAN-ODT) disintegrating tablet 8 mg (8 mg Oral Given 10/03/14 2049)  ibuprofen (ADVIL,MOTRIN) tablet 800 mg (800 mg Oral Given 10/03/14 2110)  antipyrine-benzocaine (AURALGAN) otic solution 3-4 drop (3 drops Right Ear Given 10/03/14 2110)  oxyCODONE-acetaminophen (PERCOCET/ROXICET) 5-325 MG per tablet 2 tablet (2 tablets Oral Given 10/03/14 2110)   Labs Review Labs Reviewed - No data to display  Imaging Review No results found.   EKG Interpretation None      MDM   Final diagnoses:  Otalgia, right    Filed Vitals:   10/03/14 2105  BP:  115/77  Pulse: 66  Temp:   Resp: 22   Afebrile, NAD, non-toxic appearing, AAOx4. Patient with draining pustule in right ear canal. TM is clear. No canal occlusion. No mastoid tenderness or swelling. Symptoms managed. Will prescribe otic drops with advised ENT follow-up for possible drainage. Return precautions were discussed. Patient is agreeable to plan. Patient is stable at time of discharge       Harlow Mares, PA-C 10/04/14 [place], MD 10/04/14 1447

## 2014-10-03 NOTE — ED Notes (Signed)
Pt reports severe right ear infection x 3 days, also having n/v. No acute distress noted at triage.

## 2014-10-03 NOTE — Discharge Instructions (Signed)
Please follow up with your primary care physician in 1-2 days. If you do not have one please call the [facility] number listed above. Please follow up with Dr. Janace Hoard as needed to schedule a follow up appointment.  Please use the antibiotic drops as given to you in the ER, 4 drops in your right ear twice a day for seven days. Please take pain medication and/or muscle relaxants as prescribed and as needed for pain. Please do not drive on narcotic pain medication or on muscle relaxants. Please alternate between Motrin and Tylenol every three hours for fevers and pain. Please read all discharge instructions and return precautions.   Otalgia The most common reason for this in children is an infection of the middle ear. Pain from the middle ear is usually caused by a build-up of fluid and pressure behind the eardrum. Pain from an earache can be sharp, dull, or burning. The pain may be temporary or constant. The middle ear is connected to the nasal passages by a short narrow tube called the Eustachian tube. The Eustachian tube allows fluid to drain out of the middle ear, and helps keep the pressure in your ear equalized. CAUSES  A cold or allergy can block the Eustachian tube with inflammation and the build-up of secretions. This is especially likely in small children, because their Eustachian tube is shorter and more horizontal. When the Eustachian tube closes, the normal flow of fluid from the middle ear is stopped. Fluid can accumulate and cause stuffiness, pain, hearing loss, and an ear infection if germs start growing in this area. SYMPTOMS  The symptoms of an ear infection may include fever, ear pain, fussiness, increased crying, and irritability. Many children will have temporary and minor hearing loss during and right after an ear infection. Permanent hearing loss is rare, but the risk increases the more infections a child has. Other causes of ear pain include retained water in the  outer ear canal from swimming and bathing. Ear pain in adults is less likely to be from an ear infection. Ear pain may be referred from other locations. Referred pain may be from the joint between your jaw and the skull. It may also come from a tooth problem or problems in the neck. Other causes of ear pain include:  A foreign body in the ear.  Outer ear infection.  Sinus infections.  Impacted ear wax.  Ear injury.  Arthritis of the jaw or TMJ problems.  Middle ear infection.  Tooth infections.  Sore throat with pain to the ears. DIAGNOSIS  Your caregiver can usually make the diagnosis by examining you. Sometimes other special studies, including x-rays and lab work may be necessary. TREATMENT   If antibiotics were prescribed, use them as directed and finish them even if you or your child's symptoms seem to be improved.  Sometimes PE tubes are needed in children. These are little plastic tubes which are put into the eardrum during a simple surgical procedure. They allow fluid to drain easier and allow the pressure in the middle ear to equalize. This helps relieve the ear pain caused by pressure changes. HOME CARE INSTRUCTIONS   Only take over-the-counter or prescription medicines for pain, discomfort, or fever as directed by your caregiver. DO NOT GIVE CHILDREN ASPIRIN because of the association of Reye's Syndrome in children taking aspirin.  Use a cold pack applied to the outer ear for 15-20 minutes, 03-04 times per day or as needed may reduce pain. Do  not apply ice directly to the skin. You may cause frost bite.  Over-the-counter ear drops used as directed may be effective. Your caregiver may sometimes prescribe ear drops.  Resting in an upright position may help reduce pressure in the middle ear and relieve pain.  Ear pain caused by rapidly descending from high altitudes can be relieved by swallowing or chewing gum. Allowing infants to suck on a bottle during airplane travel  can help.  Do not smoke in the house or near children. If you are unable to quit smoking, smoke outside.  Control allergies. SEEK IMMEDIATE MEDICAL CARE IF:   You or your child are becoming sicker.  Pain or fever relief is not obtained with medicine.  You or your child's symptoms (pain, fever, or irritability) do not improve within 24 to 48 hours or as instructed.  Severe pain suddenly stops hurting. This may indicate a ruptured eardrum.  You or your children develop new problems such as severe headaches, stiff neck, difficulty swallowing, or swelling of the face or around the ear. Document Released: 06/24/2004 Document Revised: 01/30/2012 Document Reviewed: 10/29/2008 [facility] Patient Information 2015 [place], [place]. This information is not intended to replace advice given to you by your health care provider. Make sure you discuss any questions you have with your health care provider.

## 2015-03-13 NOTE — Consult Note (Signed)
Brief Consult Note: Diagnosis: Polysubstance dependence.   Patient was seen by consultant.   Consult note dictated.   Recommend further assessment or treatment.   Orders entered.   Comments: Ms. Ventola is polysubstance abuser. She came here asking for help.  PLAN: 1. She is referred to [facility]. The husband will transport her when bed available,  2. We will offer benzodiazepine and opioid detox.   3. Psychiatry will follow up.  Electronic Signatures: Orson Slick (MD)  (Signed 10-Oct-14 15:28)  Authored: Brief Consult Note   Last Updated: 10-Oct-14 15:28 by Orson Slick (MD)

## 2015-03-13 NOTE — H&P (Signed)
PATIENT NAME:  Alyssa Shaffer, Alyssa Shaffer MR#:  [id number] DATE OF BIRTH:  [birth year]  DATE OF ADMISSION:  08/30/2013  REFERRING PHYSICIAN: [facility] Room M.D.   ATTENDING PHYSICIAN: Katrina Brosh B. Bary Leriche, M.D.   IDENTIFYING DATA: Alyssa Shaffer is a 39 year old female with history of substance abuse.   CHIEF COMPLAINT: "I need detox."   HISTORY OF PRESENT ILLNESS:  Alyssa Shaffer has a long history of heavy drug use. She abuses cocaine, opioids and Xanax. She uses every day, spending 200 to 300 dollars daily. In order to support her big habit, she steals and sells. Her husband gave her an Estate agent. It was her son's 8th birthday. She was told that she either go for treatment and straighten up, or she would not be able to return home. The patient was brought to the hospital by the husband. She is ready to submit herself to treatment. She denies symptoms of depression, anxiety, psychosis. She denies heavy drinking. She does feel depressed, anxious and tearful now, but she is just frightened of what is going to happen to her. She believes that the husband would kick her out. She already has been disowned by her mother and father.   PAST PSYCHIATRIC HISTORY: She has never been treated for depression, anxiety, substance abuse. She never attempted suicide.   FAMILY PSYCHIATRIC HISTORY:  None reported.   PAST MEDICAL HISTORY: Reportedly, she had cervical cancer, but upon further questioning, we discovered that it has been a long time ago, but she still thinks that her pain deserves narcotics. She is prescribed Xanax and Vicodin by a pain clinic in [place].   MEDICATIONS ON ADMISSION: Vicodin 10 mg 3 times daily per pharmacy 1 time daily, Xanax 1 mg 3 times daily, per pharmacy once daily as needed.   ALLERGIES: No known drug allergies.   SOCIAL HISTORY: As above. She is married. She has an 70-year-old child. She does not work. She is about to lose her housing and the love of her family.   REVIEW OF  SYSTEMS:    CONSTITUTIONAL: No fevers or chills. No weight changes.  EYES: No double or blurred vision.  ENT: No hearing loss.  RESPIRATORY: No shortness of breath or cough.  CARDIOVASCULAR: No chest pain or orthopnea.  GASTROINTESTINAL: No abdominal pain, nausea, vomiting or diarrhea.  GENITOURINARY: No incontinence or frequency.  ENDOCRINE: No heat or cold intolerance.  LYMPHATIC: No anemia or easy bruising.  INTEGUMENTARY: No acne or rash.  MUSCULOSKELETAL: No muscle or joint pain.  NEUROLOGIC: No tingling or weakness.  PSYCHIATRIC: See history of present illness for details.   PHYSICAL EXAMINATION: VITAL SIGNS: Blood pressure 117/71, pulse 61, respirations 19, temperature 96.5.  GENERAL: This is a slender female in no acute distress.  HEENT: The pupils are equal, round and reactive to light. Sclerae are anicteric.  NECK: Supple. No thyromegaly.  LUNGS: Clear to auscultation. No dullness to percussion.  HEART: Regular rhythm and rate. No murmurs, rubs or gallops.  ABDOMEN: Soft, nontender, nondistended. Positive bowel sounds.  MUSCULOSKELETAL: Normal muscle strength in all extremities.  SKIN: No rashes or bruises.  LYMPHATIC: No cervical adenopathy.  NEUROLOGIC: Cranial nerves II through XII are intact.   LABORATORY DATA: Chemistries are within normal limits with potassium 3.3. Blood alcohol level is 0. LFTs within normal limits. TSH 8.26. Urine tox screen positive for cocaine and opioids. CBC within normal limits. Urinalysis is not suggestive of urinary tract infection.   MENTAL STATUS EXAMINATION ON ADMISSION: The patient is alert and oriented to person, place,  time and situation. She is pleasant, polite and cooperative, slightly tearful talking about her troubles. She is well groomed and wearing hospital scrubs and a yellow shirt. Her speech is slightly slurred, most likely from a speech impediment, sometimes difficult to understand. Her mood is depressed with tearful affect.  Thought process is logical and goal oriented. Thought content: She denies suicidal or homicidal ideation initially, but states that if she is unable to maintain sobriety and has to live in the streets, she will  kill herself. She has been thinking about it quite a bit and made the decision already. There are no delusions or paranoia. There are no auditory or visual hallucinations. Her cognition is grossly intact. She registers 3 out of 3 and recalls 3 out of 3 objects after 5 minutes. She can spell world forwards and backwards. She knows the current president. Her insight and judgment are limited.   SUICIDE RISK ASSESSMENT ON ADMISSION: This is a patient with a long history of heavy substance use who came to the hospital upon her husband's urging, and who feels that she is unsafe if not able to maintain sobriety.   DIAGNOSES: AXIS I: Polysubstance dependence.  Substance-induced mood disorder.  AXIS II: Deferred.  AXIS III: Chronic pain.  AXIS IV: Mental illness, substance abuse.  AXIS V: Global assessment of functioning 25.   PLAN: The patient was admitted to [facility] for safety, stabilization and medication management. She was initially placed on suicide precautions and was closely monitored for any unsafe behaviors. She underwent full psychiatric and risk assessment. She received pharmacotherapy, individual and group psychotherapy, substance abuse counseling and support from therapeutic milieu.  1.  Suicidal ideation: The patient is able to contract for safety.  2.  Substance abuse. She is on symptomatic treatment for opioid withdrawal. She is on low-dose Librium for a few days to prevent symptoms of benzodiazepine withdrawal. I hope that she will not require CIWA. I would prefer to avoid bargaining for benzodiazepines.  3.  She is referred to [facility] treatment facility and will be transferred when bed is available.   DISPOSITION: To be established.      ____________________________ Wardell Honour. Bary Leriche, MD [id number] D: 08/30/2013 21:50:33 ET T: 08/30/2013 22:47:07 ET JOB#: [id number]  cc: Ismar Yabut B. Bary Leriche, MD, <Dictator> Clovis Fredrickson MD ELECTRONICALLY SIGNED 09/03/2013 20:59

## 2015-05-19 ENCOUNTER — Encounter: Payer: Self-pay | Admitting: Obstetrics and Gynecology

## 2015-06-25 ENCOUNTER — Encounter: Payer: Medicaid Other | Admitting: Obstetrics and Gynecology

## 2015-10-23 ENCOUNTER — Emergency Department (HOSPITAL_COMMUNITY)
Admission: EM | Admit: 2015-10-23 | Discharge: 2015-10-24 | Disposition: A | Discharge status: Home / Self Care | Payer: Medicaid Other | Attending: Emergency Medicine | Admitting: Emergency Medicine

## 2015-10-23 ENCOUNTER — Emergency Department (HOSPITAL_COMMUNITY): Payer: Medicaid Other

## 2015-10-23 ENCOUNTER — Encounter (HOSPITAL_COMMUNITY): Payer: Self-pay | Admitting: Emergency Medicine

## 2015-10-23 DIAGNOSIS — Y92410 Unspecified street and highway as the place of occurrence of the external cause: Secondary | ICD-10-CM | POA: Diagnosis not present

## 2015-10-23 DIAGNOSIS — S199XXA Unspecified injury of neck, initial encounter: Secondary | ICD-10-CM | POA: Insufficient documentation

## 2015-10-23 DIAGNOSIS — M542 Cervicalgia: Secondary | ICD-10-CM

## 2015-10-23 DIAGNOSIS — F17200 Nicotine dependence, unspecified, uncomplicated: Secondary | ICD-10-CM | POA: Diagnosis not present

## 2015-10-23 DIAGNOSIS — S29001A Unspecified injury of muscle and tendon of front wall of thorax, initial encounter: Secondary | ICD-10-CM | POA: Insufficient documentation

## 2015-10-23 DIAGNOSIS — Z859 Personal history of malignant neoplasm, unspecified: Secondary | ICD-10-CM | POA: Insufficient documentation

## 2015-10-23 DIAGNOSIS — Y9389 Activity, other specified: Secondary | ICD-10-CM | POA: Insufficient documentation

## 2015-10-23 DIAGNOSIS — Y998 Other external cause status: Secondary | ICD-10-CM | POA: Insufficient documentation

## 2015-10-23 DIAGNOSIS — Z8742 Personal history of other diseases of the female genital tract: Secondary | ICD-10-CM | POA: Insufficient documentation

## 2015-10-23 DIAGNOSIS — Z79899 Other long term (current) drug therapy: Secondary | ICD-10-CM | POA: Diagnosis not present

## 2015-10-23 DIAGNOSIS — R52 Pain, unspecified: Secondary | ICD-10-CM

## 2015-10-23 DIAGNOSIS — I10 Essential (primary) hypertension: Secondary | ICD-10-CM | POA: Diagnosis not present

## 2015-10-23 DIAGNOSIS — R071 Chest pain on breathing: Secondary | ICD-10-CM

## 2015-10-23 DIAGNOSIS — S0990XA Unspecified injury of head, initial encounter: Secondary | ICD-10-CM | POA: Diagnosis present

## 2015-10-23 DIAGNOSIS — S0083XA Contusion of other part of head, initial encounter: Secondary | ICD-10-CM | POA: Diagnosis not present

## 2015-10-23 LAB — HCG, SERUM, QUALITATIVE: PREG SERUM: NEGATIVE

## 2015-10-23 MED ORDER — MORPHINE SULFATE (PF) 4 MG/ML IV SOLN
6.0000 mg | Freq: Once | INTRAVENOUS | Status: AC
Start: 2015-10-23 — End: 2015-10-23
  Administered 2015-10-23: 6 mg via INTRAVENOUS
  Filled 2015-10-23: qty 2

## 2015-10-23 MED ORDER — MORPHINE SULFATE (PF) 4 MG/ML IV SOLN
4.0000 mg | Freq: Once | INTRAVENOUS | Status: AC
  Administered 2015-10-23: 4 mg via INTRAVENOUS
  Filled 2015-10-23: qty 1
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
## 2015-10-23 NOTE — ED Notes (Signed)
Dr. Mumma at bedside.
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
## 2015-10-23 NOTE — ED Notes (Signed)
Pt taken to CT.

## 2015-10-23 NOTE — ED Notes (Signed)
Pt requesting pain medication; Dr.Mumma aware

## 2016-04-26 ENCOUNTER — Other Ambulatory Visit: Payer: Self-pay | Admitting: Adult Health

## 2016-04-26 DIAGNOSIS — N644 Mastodynia: Secondary | ICD-10-CM

## 2016-05-02 ENCOUNTER — Other Ambulatory Visit: Payer: Self-pay

## 2016-05-12 ENCOUNTER — Ambulatory Visit
Admission: RE | Admit: 2016-05-12 | Discharge: 2016-05-12 | Disposition: A | Discharge status: Home / Self Care | Payer: Medicaid Other | Source: Ambulatory Visit | Attending: Adult Health | Admitting: Adult Health

## 2016-05-12 ENCOUNTER — Other Ambulatory Visit: Payer: Self-pay | Admitting: Adult Health

## 2016-05-12 DIAGNOSIS — N644 Mastodynia: Secondary | ICD-10-CM

## 2016-05-12 DIAGNOSIS — R928 Other abnormal and inconclusive findings on diagnostic imaging of breast: Secondary | ICD-10-CM

## 2016-07-26 ENCOUNTER — Emergency Department (HOSPITAL_COMMUNITY)
Admission: EM | Admit: 2016-07-26 | Discharge: 2016-07-26 | Disposition: A | Discharge status: Home / Self Care | Payer: Medicaid Other | Attending: Emergency Medicine | Admitting: Emergency Medicine

## 2016-07-26 ENCOUNTER — Encounter (HOSPITAL_COMMUNITY): Payer: Self-pay

## 2016-07-26 DIAGNOSIS — F17200 Nicotine dependence, unspecified, uncomplicated: Secondary | ICD-10-CM | POA: Diagnosis not present

## 2016-07-26 DIAGNOSIS — B029 Zoster without complications: Secondary | ICD-10-CM

## 2016-07-26 DIAGNOSIS — I10 Essential (primary) hypertension: Secondary | ICD-10-CM | POA: Diagnosis not present

## 2016-07-26 DIAGNOSIS — B0239 Other herpes zoster eye disease: Secondary | ICD-10-CM | POA: Diagnosis not present

## 2016-07-26 DIAGNOSIS — Z79899 Other long term (current) drug therapy: Secondary | ICD-10-CM | POA: Insufficient documentation

## 2016-07-26 DIAGNOSIS — Z859 Personal history of malignant neoplasm, unspecified: Secondary | ICD-10-CM | POA: Diagnosis not present

## 2016-07-26 DIAGNOSIS — B0221 Postherpetic geniculate ganglionitis: Secondary | ICD-10-CM | POA: Diagnosis not present

## 2016-07-26 DIAGNOSIS — B0230 Zoster ocular disease, unspecified: Secondary | ICD-10-CM

## 2016-07-26 DIAGNOSIS — R21 Rash and other nonspecific skin eruption: Secondary | ICD-10-CM | POA: Diagnosis present

## 2016-07-26 MED ORDER — VALACYCLOVIR HCL 1 G PO TABS: 1000.0000 mg | ORAL_TABLET | Freq: Three times a day (TID) | ORAL | 0 refills | Status: AC

## 2016-07-26 MED ORDER — VALACYCLOVIR HCL 500 MG PO TABS
1000.0000 mg | ORAL_TABLET | Freq: Once | ORAL | Status: AC
  Administered 2016-07-26: 1000 mg via ORAL
  Filled 2016-07-26: qty 2

## 2016-07-26 MED ORDER — FLUORESCEIN SODIUM 1 MG OP STRP
1.0000 | ORAL_STRIP | Freq: Once | OPHTHALMIC | Status: AC
  Administered 2016-07-26: 1 via OPHTHALMIC
  Filled 2016-07-26: qty 1

## 2016-07-26 MED ORDER — TETRACAINE HCL 0.5 % OP SOLN
1.0000 [drp] | Freq: Once | OPHTHALMIC | Status: AC
  Administered 2016-07-26: 1 [drp] via OPHTHALMIC
  Filled 2016-07-26: qty 2

## 2016-07-26 MED ORDER — PREDNISONE 10 MG (21) PO TBPK: 10.0000 mg | ORAL_TABLET | Freq: Every day | ORAL | 0 refills | Status: DC

## 2016-07-26 MED ORDER — HYDROCODONE-ACETAMINOPHEN 5-325 MG PO TABS
1.0000 | ORAL_TABLET | Freq: Once | ORAL | Status: AC
  Administered 2016-07-26: 1 via ORAL
  Filled 2016-07-26: qty 1

## 2016-07-26 MED ORDER — PREDNISONE 20 MG PO TABS
60.0000 mg | ORAL_TABLET | Freq: Once | ORAL | Status: AC
  Administered 2016-07-26: 60 mg via ORAL
  Filled 2016-07-26: qty 3

## 2016-07-26 MED ORDER — HYDROCODONE-ACETAMINOPHEN 5-325 MG PO TABS: 1.0000 | ORAL_TABLET | ORAL | 0 refills | Status: DC | PRN

## 2016-07-26 MED ORDER — AMOXICILLIN 500 MG PO CAPS: 500.0000 mg | ORAL_CAPSULE | Freq: Three times a day (TID) | ORAL | 0 refills | Status: DC

## 2016-07-26 NOTE — Discharge Instructions (Signed)
Follow up with Dr. Posey Pronto tomorrow at 10:00 AM. Follow up with your primary care provider as soon as possible. Take medications as prescribed. Return to the emergency room for new or worsening symptoms.

## 2016-07-26 NOTE — ED Notes (Signed)
Patient unable to ambulate to room without assistance.  States dizzy and "off balance".  Assisted patient to room, Luellen Pucker, RN notified.

## 2016-07-26 NOTE — ED Provider Notes (Signed)
[facility] DEPT Provider Note   CSN: [id number] Arrival date & time: 07/26/16  1550 By signing my name below, I, Alyssa Shaffer, attest that this documentation has been prepared under the direction and in the presence of [name], [place]. Electronically Signed: Georgette Shaffer, ED Scribe. 07/26/16. 5:31 PM.  History   Chief Complaint Chief Complaint  Patient presents with  . Rash  . Otalgia   HPI Comments: Alyssa Shaffer is a 40 y.o. female who presents to the Emergency Department complaining of pain ful rash to left forehead and constant, 10/10 left ear ache onset one week ago. No ear drainage present. Pt also has associated dizziness at times. She noticed the rash about three days ago and her ear pain started one week ago. Pain is exacerbated with palpation to both areas. Denies tinnitus or hearing loss. Endorses some decreased visual acuity in her left eye. Pt has put calamine lotion on the area with no relief. No alleviating factors noted. No new soaps, lotions, detergents, foods, animals, plants, or medications. Denies h/o similar symptoms. Pt denies fever. Denies feeling faint or lightheaded. Endorses history of chickenpox as a child. Pt states she thinks her rash is poison ivy. Denies hiking or working outside. States she has outdoor cats and think the cats got the plant oils on her pillow.  The history is provided by the patient. No language interpreter was used.  Ear Pain    Past Medical History:  Diagnosis Date  . Cancer ([place])   . Cyst of fallopian tube 08/02/2013  . Hypertension     Patient Active Problem List   Diagnosis Date Noted  . Chronic pelvic pain in female 09/28/2013    Past Surgical History:  Procedure Laterality Date  . CERVIX REMOVAL     partial  . TONSILLECTOMY    . TUBAL LIGATION      OB History    No data available       Home Medications    Prior to Admission medications   Medication Sig Start Date End Date Taking? Authorizing Provider    amoxicillin (AMOXIL) 500 MG tablet Take 1 tablet (500 mg total) by mouth 2 (two) times daily. Patient not taking: Reported on 10/03/2014 04/09/14   Margarita Mail, PA-C  buPROPion (WELLBUTRIN XL) 150 MG 24 hr tablet Take 150 mg by mouth daily. 10/05/15   Historical Provider, MD  ciprofloxacin-dexamethasone (CIPRODEX) otic suspension Place 4 drops into the right ear 2 (two) times daily. X 7 days Patient not taking: Reported on 10/23/2015 10/03/14   Baron Sane, PA-C  ibuprofen (ADVIL,MOTRIN) 800 MG tablet Take 1 tablet (800 mg total) by mouth 3 (three) times daily. Patient not taking: Reported on 10/23/2015 10/03/14   Baron Sane, PA-C  methimazole (TAPAZOLE) 5 MG tablet Take 5 mg by mouth 3 (three) times daily. 10/08/15   Historical Provider, MD  metroNIDAZOLE (FLAGYL) 500 MG tablet Take 1 tablet (500 mg total) by mouth 2 (two) times daily. Patient not taking: Reported on 10/03/2014 11/05/13   Lahoma Crocker, MD  omeprazole (PRILOSEC) 20 MG capsule Take 20 mg by mouth daily. 08/06/15   Historical Provider, MD  ondansetron (ZOFRAN ODT) 4 MG disintegrating tablet Take 1 tablet (4 mg total) by mouth every 8 (eight) hours as needed for nausea or vomiting. Patient not taking: Reported on 10/23/2015 10/03/14   Baron Sane, PA-C  oxyCODONE-acetaminophen (PERCOCET) 10-325 MG tablet Take 1 tablet by mouth every 6 (six) hours. for pain 09/09/15   Historical Provider, MD  oxyCODONE-acetaminophen (PERCOCET/ROXICET) 5-325 MG per tablet Take 1 tablet by mouth every 8 (eight) hours as needed for severe pain. May take 2 tablets PO q 6 hours for severe pain - Do not take with Tylenol as this tablet already contains tylenol Patient not taking: Reported on 10/23/2015 10/03/14   Baron Sane, PA-C  traMADol (ULTRAM) 50 MG tablet Take 1 tablet (50 mg total) by mouth every 6 (six) hours as needed. Patient not taking: Reported on 10/03/2014 10/11/13   Timmothy Euler, MD    Family  History Family History  Problem Relation Age of Onset  . Heart disease Mother   . Seizures Mother   . Hypertension Father   . Heart disease Father     Social History Social History  Substance Use Topics  . Smoking status: Current Every Day Smoker    Packs/day: 1.00    Years: 27.00  . Smokeless tobacco: Never Used  . Alcohol use Yes     Comment: socailly     Allergies   Bee venom and Meloxicam   Review of Systems Review of Systems  10 Systems reviewed and all are negative for acute change except as noted in the HPI. Physical Exam Updated Vital Signs BP 124/93 (BP Location: Right Arm)   Pulse 78   Temp 98.2 F (36.8 C) (Oral)   Resp 18   Ht 5\' 3"  (1.6 m)   Wt 130 lb (59 kg)   LMP 07/21/2016 (Within Days)   SpO2 98%   BMI 23.03 kg/m   Physical Exam  Constitutional: She appears well-developed and well-nourished.  Appears uncomfortable.  HENT:  Head: Normocephalic.  Numerous vesicular lesions from midline of forehead extending to left lateral temple. Some lesions down bridge of nose and left eyelid. Few lesions in anterior left scalp. Rash is tender. Bilateral TM with effusion no bulging or retraction. No canal edema. No edema, no vesicles, no tracheal or mastoid tenderness.  Eyes: Conjunctivae and EOM are normal. Pupils are equal, round, and reactive to light.  Left eyelid mildly edematous and slightly drooping. No fluorescein uptake. No dendritic lesion.  Cardiovascular: Normal rate.   Pulmonary/Chest: Effort normal. No respiratory distress.  Abdominal: She exhibits no distension.  Musculoskeletal: Normal range of motion.  Neurological: She is alert. No cranial nerve deficit.  Cannot raise left eyebrow. Cannot close left eye as tight as right. Cranial nerves otherwise intact.   Skin: Skin is warm and dry. Rash noted.  Psychiatric: She has a normal mood and affect. Her behavior is normal.  Nursing note and vitals reviewed.   Visual Acuity  Right Eye  Distance: 20/20 without corrective lens Left Eye Distance: 20/30 without corrective lens Bilateral Distance: 20/20 without corrective lens  Right Eye Near:   Left Eye Near:    Bilateral Near:    acuity   ED Treatments / Results  DIAGNOSTIC STUDIES: Oxygen Saturation is 98% on RA, normal by my interpretation.    COORDINATION OF CARE: 5:12 PM Discussed treatment plan with pt at bedside which includes anti-viral medication and pt agreed to plan.  Labs (all labs ordered are listed, but only abnormal results are displayed) Labs Reviewed - No data to display  EKG  EKG Interpretation None       Radiology No results found.  Procedures Procedures (including critical care time)  Medications Ordered in ED Medications - No data to display   Initial Impression / Assessment and Plan / ED Course  I have reviewed the triage vital signs  and the nursing notes.  Pertinent labs & imaging results that were available during my care of the patient were reviewed by me and considered in my medical decision making (see chart for details).  Clinical Course   6:50 PM Pt with shingles, +hutchinson sign. Although she has no vesicles in ear she does have other signs and symptoms of Ramsay Hunt Syndrome. I spoke with Dr. Posey Pronto of ophthalmology who will see pt in clinic tomorrow 10 AM. Otherwise no topical eye therapy indicated at this time. Discussed case with attending Dr. Eulis Foster. Will treat with course of oral valcyclovir and prednisone course. Pt is insisting she gets a prescription for antibiotics for what she thinks is an ear infection. I explained her ear exam not consistent with AOM but she states she "knows" she needs antibiotics. Rx given for amoxicillin. ER return precautions given.   Final Clinical Impressions(s) / ED Diagnoses   Final diagnoses:  Shingles  Herpes zoster ophthalmicus  Ramsay Hunt syndrome (geniculate herpes zoster)    New Prescriptions Discharge Medication List as  of 07/26/2016  6:58 PM    START taking these medications   Details  amoxicillin (AMOXIL) 500 MG capsule Take 1 capsule (500 mg total) by mouth 3 (three) times daily., Starting Tue 07/26/2016, Print    HYDROcodone-acetaminophen (NORCO/VICODIN) 5-325 MG tablet Take 1-2 tablets by mouth every 4 (four) hours as needed., Starting Tue 07/26/2016, Print    predniSONE (STERAPRED UNI-PAK 21 TAB) 10 MG (21) TBPK tablet Take 1 tablet (10 mg total) by mouth daily. Take 6 tabs by mouth daily  for 1 days, then 5 tabs for 1 day, then 4 tabs for 1 days, then 3 tabs for 1 days, 2 tabs for 1 days, then 1 tab by mouth for 1 day, Starting Tue 07/26/2016, Print    valACYclovir (VALTREX) 1000 MG tablet Take 1 tablet (1,000 mg total) by mouth 3 (three) times daily., Starting Tue 07/26/2016, Until Tue 08/09/2016, Print       I personally performed the services described in this documentation, which was scribed in my presence. The recorded information has been reviewed and is accurate.   Anne Ng, PA-C 07/26/16 2139    Daleen Bo, MD 07/27/16 [phone]

## 2016-07-26 NOTE — ED Triage Notes (Signed)
Pt reports left side earache for more than 1 week. She also has poison oak rash on the left side of her forehead. Pt has been treating with calamine lotion without relief.

## 2016-07-26 NOTE — ED Notes (Signed)
Declined W/C at D/C and was escorted to lobby by RN. 

## 2016-07-28 ENCOUNTER — Inpatient Hospital Stay (HOSPITAL_COMMUNITY)
Admission: EM | Admit: 2016-07-28 | Discharge: 2016-07-31 | DRG: 125 | Disposition: A | Discharge status: Home / Self Care | Payer: Medicaid Other | Attending: Internal Medicine | Admitting: Internal Medicine

## 2016-07-28 ENCOUNTER — Encounter (HOSPITAL_COMMUNITY): Payer: Self-pay

## 2016-07-28 DIAGNOSIS — I10 Essential (primary) hypertension: Secondary | ICD-10-CM | POA: Diagnosis not present

## 2016-07-28 DIAGNOSIS — F17200 Nicotine dependence, unspecified, uncomplicated: Secondary | ICD-10-CM | POA: Diagnosis present

## 2016-07-28 DIAGNOSIS — E0590 Thyrotoxicosis, unspecified without thyrotoxic crisis or storm: Secondary | ICD-10-CM | POA: Diagnosis present

## 2016-07-28 DIAGNOSIS — H538 Other visual disturbances: Secondary | ICD-10-CM | POA: Diagnosis present

## 2016-07-28 DIAGNOSIS — R52 Pain, unspecified: Secondary | ICD-10-CM

## 2016-07-28 DIAGNOSIS — B029 Zoster without complications: Secondary | ICD-10-CM

## 2016-07-28 DIAGNOSIS — B0221 Postherpetic geniculate ganglionitis: Secondary | ICD-10-CM | POA: Insufficient documentation

## 2016-07-28 DIAGNOSIS — D696 Thrombocytopenia, unspecified: Secondary | ICD-10-CM | POA: Diagnosis present

## 2016-07-28 DIAGNOSIS — Z8541 Personal history of malignant neoplasm of cervix uteri: Secondary | ICD-10-CM | POA: Diagnosis not present

## 2016-07-28 DIAGNOSIS — B028 Zoster with other complications: Secondary | ICD-10-CM | POA: Diagnosis not present

## 2016-07-28 DIAGNOSIS — B0230 Zoster ocular disease, unspecified: Principal | ICD-10-CM | POA: Insufficient documentation

## 2016-07-28 DIAGNOSIS — Z79899 Other long term (current) drug therapy: Secondary | ICD-10-CM | POA: Diagnosis not present

## 2016-07-28 LAB — HEPATIC FUNCTION PANEL
ALBUMIN: 4 g/dL (ref 3.5–5.0)
ALK PHOS: 59 U/L (ref 38–126)
ALT: 11 U/L — ABNORMAL LOW (ref 14–54)
AST: 16 U/L (ref 15–41)
Bilirubin, Direct: 0.1 mg/dL (ref 0.1–0.5)
Indirect Bilirubin: 0.5 mg/dL (ref 0.3–0.9)
TOTAL PROTEIN: 7 g/dL (ref 6.5–8.1)
Total Bilirubin: 0.6 mg/dL (ref 0.3–1.2)

## 2016-07-28 LAB — BASIC METABOLIC PANEL
ANION GAP: 7 (ref 5–15)
BUN: 5 mg/dL — ABNORMAL LOW (ref 6–20)
CALCIUM: 9.3 mg/dL (ref 8.9–10.3)
CO2: 25 mmol/L (ref 22–32)
Chloride: 107 mmol/L (ref 101–111)
Creatinine, Ser: 0.79 mg/dL (ref 0.44–1.00)
GLUCOSE: 108 mg/dL — AB (ref 65–99)
POTASSIUM: 4.2 mmol/L (ref 3.5–5.1)
Sodium: 139 mmol/L (ref 135–145)

## 2016-07-28 LAB — CBC WITH DIFFERENTIAL/PLATELET
BASOS ABS: 0 10*3/uL (ref 0.0–0.1)
Basophils Relative: 1 %
EOS PCT: 1 %
Eosinophils Absolute: 0 10*3/uL (ref 0.0–0.7)
HEMATOCRIT: 41.2 % (ref 36.0–46.0)
Hemoglobin: 13.8 g/dL (ref 12.0–15.0)
LYMPHS ABS: 1.8 10*3/uL (ref 0.7–4.0)
LYMPHS PCT: 37 %
MCH: 30 pg (ref 26.0–34.0)
MCHC: 33.5 g/dL (ref 30.0–36.0)
MCV: 89.6 fL (ref 78.0–100.0)
MONOS PCT: 15 %
Monocytes Absolute: 0.7 10*3/uL (ref 0.1–1.0)
NEUTROS PCT: 46 %
Neutro Abs: 2.3 10*3/uL (ref 1.7–7.7)
Platelets: 106 10*3/uL — ABNORMAL LOW (ref 150–400)
RBC: 4.6 MIL/uL (ref 3.87–5.11)
RDW: 14.7 % (ref 11.5–15.5)
WBC: 4.8 10*3/uL (ref 4.0–10.5)

## 2016-07-28 LAB — I-STAT CG4 LACTIC ACID, ED: Lactic Acid, Venous: 0.7 mmol/L (ref 0.5–1.9)

## 2016-07-28 MED ORDER — TETRACAINE HCL 0.5 % OP SOLN
2.0000 [drp] | Freq: Once | OPHTHALMIC | Status: AC
  Administered 2016-07-28: 2 [drp] via OPHTHALMIC
  Filled 2016-07-28: qty 2

## 2016-07-28 MED ORDER — ONDANSETRON HCL 4 MG/2ML IJ SOLN
4.0000 mg | Freq: Four times a day (QID) | INTRAMUSCULAR | Status: DC | PRN
Start: 2016-07-28 — End: 2016-07-31
  Administered 2016-07-29: 4 mg via INTRAVENOUS
  Filled 2016-07-28: qty 2

## 2016-07-28 MED ORDER — OXYCODONE-ACETAMINOPHEN 5-325 MG PO TABS
2.0000 | ORAL_TABLET | Freq: Once | ORAL | Status: AC
  Administered 2016-07-28: 2 via ORAL
  Filled 2016-07-28: qty 2

## 2016-07-28 MED ORDER — HYDROMORPHONE HCL 1 MG/ML IJ SOLN
1.0000 mg | Freq: Once | INTRAMUSCULAR | Status: AC
  Administered 2016-07-28: 1 mg via INTRAVENOUS
  Filled 2016-07-28: qty 1

## 2016-07-28 MED ORDER — PREDNISONE 20 MG PO TABS
60.0000 mg | ORAL_TABLET | Freq: Every day | ORAL | Status: DC
  Administered 2016-07-30 – 2016-07-31 (×2): 60 mg via ORAL
  Filled 2016-07-28 (×3): qty 3

## 2016-07-28 MED ORDER — METHIMAZOLE 5 MG PO TABS
5.0000 mg | ORAL_TABLET | Freq: Three times a day (TID) | ORAL | Status: DC
  Administered 2016-07-29 (×3): 5 mg via ORAL
  Filled 2016-07-28 (×4): qty 1

## 2016-07-28 MED ORDER — SODIUM CHLORIDE 0.9 % IV BOLUS (SEPSIS)
1000.0000 mL | Freq: Once | INTRAVENOUS | Status: AC
  Administered 2016-07-28: 1000 mL via INTRAVENOUS

## 2016-07-28 MED ORDER — ONDANSETRON HCL 4 MG PO TABS: 4.0000 mg | ORAL_TABLET | Freq: Four times a day (QID) | ORAL | Status: DC | PRN

## 2016-07-28 MED ORDER — BUPROPION HCL ER (XL) 150 MG PO TB24
150.0000 mg | ORAL_TABLET | Freq: Every day | ORAL | Status: DC
  Administered 2016-07-30 – 2016-07-31 (×2): 150 mg via ORAL
  Filled 2016-07-28 (×3): qty 1

## 2016-07-28 MED ORDER — PANTOPRAZOLE SODIUM 40 MG PO TBEC
40.0000 mg | DELAYED_RELEASE_TABLET | Freq: Every day | ORAL | Status: DC
  Administered 2016-07-29 – 2016-07-31 (×4): 40 mg via ORAL
  Filled 2016-07-28 (×4): qty 1

## 2016-07-28 MED ORDER — HYDROCODONE-ACETAMINOPHEN 5-325 MG PO TABS
1.0000 | ORAL_TABLET | ORAL | Status: DC | PRN
Start: 2016-07-28 — End: 2016-07-29
  Administered 2016-07-29 (×2): 2 via ORAL
  Filled 2016-07-28 (×2): qty 2

## 2016-07-28 MED ORDER — ACETAMINOPHEN 650 MG RE SUPP: 650.0000 mg | Freq: Four times a day (QID) | RECTAL | Status: DC | PRN

## 2016-07-28 MED ORDER — ACETAMINOPHEN 325 MG PO TABS: 650.0000 mg | ORAL_TABLET | Freq: Four times a day (QID) | ORAL | Status: DC | PRN

## 2016-07-28 MED ORDER — FLUORESCEIN SODIUM 1 MG OP STRP
1.0000 | ORAL_STRIP | Freq: Once | OPHTHALMIC | Status: AC
  Administered 2016-07-28: 1 via OPHTHALMIC
  Filled 2016-07-28: qty 1

## 2016-07-28 MED ORDER — AMOXICILLIN 500 MG PO CAPS
500.0000 mg | ORAL_CAPSULE | Freq: Three times a day (TID) | ORAL | Status: DC
  Administered 2016-07-29 – 2016-07-31 (×8): 500 mg via ORAL
  Filled 2016-07-28 (×9): qty 1

## 2016-07-28 MED ORDER — SODIUM CHLORIDE 0.9 % IV SOLN
INTRAVENOUS | Status: DC
  Administered 2016-07-29: 01:00:00 via INTRAVENOUS

## 2016-07-28 MED ORDER — ERYTHROMYCIN 5 MG/GM OP OINT
TOPICAL_OINTMENT | Freq: Two times a day (BID) | OPHTHALMIC | Status: DC
  Administered 2016-07-29: 1 via OPHTHALMIC
  Administered 2016-07-29 – 2016-07-31 (×3): via OPHTHALMIC
  Filled 2016-07-28: qty 3.5

## 2016-07-28 MED ORDER — HYDROMORPHONE HCL 1 MG/ML IJ SOLN
0.5000 mg | INTRAMUSCULAR | Status: DC | PRN
  Administered 2016-07-28 – 2016-07-29 (×3): 0.5 mg via INTRAVENOUS
  Filled 2016-07-28 (×3): qty 1

## 2016-07-28 MED ORDER — ACYCLOVIR SODIUM 50 MG/ML IV SOLN
10.0000 mg/kg | Freq: Three times a day (TID) | INTRAVENOUS | Status: AC
Start: 2016-07-28 — End: 2016-07-29
  Administered 2016-07-29 (×3): 590 mg via INTRAVENOUS
  Filled 2016-07-28 (×5): qty 11.8

## 2016-07-28 NOTE — ED Notes (Signed)
Attempted report x1. 

## 2016-07-28 NOTE — ED Notes (Signed)
Gave pt [place] sandwich and Ginger Ale, per Dr. Myrene Buddy. Chrislyn - RN aware.

## 2016-07-28 NOTE — ED Triage Notes (Signed)
Patient states she was here yesterday for same, but it has spread to eyes and is draining worse.   Patient states her eyes are swollen to the point she can't see well.   Spoke with Dr. Rogene Houston, will wait to order labs until patient is seen.  Negative pressure room needed, charge RN aware.

## 2016-07-28 NOTE — ED Provider Notes (Signed)
[facility] DEPT Provider Note   CSN: [id number] Arrival date & time: 07/28/16  1044   History   Chief Complaint Chief Complaint  Patient presents with  . Herpes Zoster    HPI Alyssa Shaffer is a 40 y.o. female.  HPI 40 yo F with PMHx of HTN, uterine CA who presents with facial rash and pain. Pt was seen on 07/26/16 for Ramsey-Hunt Syndrome. She was given norco, valtrex, and ABX and sent home. She followed-up with Ophtho yesterday who saw no eye involvement. Since then, pt endorses worsening, severe, 10/10 facial pain. The pain is severe, unrelenting, and made worse with any movement or palpation. No alleviating factors. She has been unable to eat/drink due to pain. No vision changes.   Past Medical History:  Diagnosis Date  . Cancer ([place])   . Cyst of fallopian tube 08/02/2013  . Hypertension     Patient Active Problem List   Diagnosis Date Noted  . 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 Intractable pain 07/28/2016  . HTN (hypertension) 07/28/2016  . Zoster ophthalmicus 07/28/2016  . Chronic pelvic pain in female 09/28/2013    Past Surgical History:  Procedure Laterality Date  . CERVIX REMOVAL     partial  . TONSILLECTOMY    . TUBAL LIGATION      OB History    No data available       Home Medications    Prior to Admission medications   Medication Sig Start Date End Date Taking? Authorizing Provider  amoxicillin (AMOXIL) 500 MG capsule Take 1 capsule (500 mg total) by mouth 3 (three) times daily. 07/26/16   Olivia Canter Sam, PA-C  amoxicillin (AMOXIL) 500 MG tablet Take 1 tablet (500 mg total) by mouth 2 (two) times daily. Patient not taking: Reported on 10/03/2014 04/09/14   Margarita Mail, PA-C  buPROPion (WELLBUTRIN XL) 150 MG 24 hr tablet Take 150 mg by mouth daily. 10/05/15   Historical Provider, MD  ciprofloxacin-dexamethasone (CIPRODEX) otic suspension Place 4  drops into the right ear 2 (two) times daily. X 7 days Patient not taking: Reported on 10/23/2015 10/03/14   Baron Sane, PA-C  HYDROcodone-acetaminophen (NORCO/VICODIN) 5-325 MG tablet Take 1-2 tablets by mouth every 4 (four) hours as needed. 07/26/16   Olivia Canter Sam, PA-C  ibuprofen (ADVIL,MOTRIN) 800 MG tablet Take 1 tablet (800 mg total) by mouth 3 (three) times daily. Patient not taking: Reported on 10/23/2015 10/03/14   Baron Sane, PA-C  methimazole (TAPAZOLE) 5 MG tablet Take 5 mg by mouth 3 (three) times daily. 10/08/15   Historical Provider, MD  metroNIDAZOLE (FLAGYL) 500 MG tablet Take 1 tablet (500 mg total) by mouth 2 (two) times daily. Patient not taking: Reported on 10/03/2014 11/05/13   Lahoma Crocker, MD  omeprazole (PRILOSEC) 20 MG capsule Take 20 mg by mouth daily. 08/06/15   Historical Provider, MD  ondansetron (ZOFRAN ODT) 4 MG disintegrating tablet Take 1 tablet (4 mg total) by mouth every 8 (eight) hours as needed for nausea or vomiting. Patient not taking: Reported on 10/23/2015 10/03/14   Baron Sane, PA-C  oxyCODONE-acetaminophen (PERCOCET) 10-325 MG tablet Take 1 tablet by mouth every 6 (six) hours. for pain 09/09/15   Historical Provider, MD  oxyCODONE-acetaminophen (PERCOCET/ROXICET) 5-325 MG per tablet Take 1 tablet by mouth every 8 (eight) hours as needed for severe pain. May take 2 tablets PO q 6 hours for severe pain - Do not take  with Tylenol as this tablet already contains tylenol Patient not taking: Reported on 10/23/2015 10/03/14   Baron Sane, PA-C  predniSONE (STERAPRED UNI-PAK 21 TAB) 10 MG (21) TBPK tablet Take 1 tablet (10 mg total) by mouth daily. Take 6 tabs by mouth daily  for 1 days, then 5 tabs for 1 day, then 4 tabs for 1 days, then 3 tabs for 1 days, 2 tabs for 1 days, then 1 tab by mouth for 1 day 07/26/16   Olivia Canter Sam, PA-C  traMADol (ULTRAM) 50 MG tablet Take 1 tablet (50 mg total) by mouth every 6 (six) hours as  needed. Patient not taking: Reported on 10/03/2014 10/11/13   Timmothy Euler, MD  valACYclovir (VALTREX) 1000 MG tablet Take 1 tablet (1,000 mg total) by mouth 3 (three) times daily. 07/26/16 08/09/16  Anne Ng, PA-C    Family History Family History  Problem Relation Age of Onset  . Heart disease Mother   . Seizures Mother   . Hypertension Father   . Heart disease Father     Social History Social History  Substance Use Topics  . Smoking status: Current Every Day Smoker    Packs/day: 1.00    Years: 27.00  . Smokeless tobacco: Never Used  . Alcohol use Yes     Comment: socailly     Allergies   Bee venom and Meloxicam   Review of Systems Review of Systems  Constitutional: Negative for chills, fatigue and fever.  HENT: Positive for facial swelling. Negative for congestion and rhinorrhea.   Eyes: Negative for pain, redness and visual disturbance.  Respiratory: Negative for cough, shortness of breath and wheezing.   Cardiovascular: Negative for chest pain and leg swelling.  Gastrointestinal: Positive for nausea. Negative for abdominal pain and diarrhea.  Genitourinary: Negative for dysuria and flank pain.  Musculoskeletal: Negative for neck pain and neck stiffness.  Skin: Positive for rash. Negative for wound.  Allergic/Immunologic: Negative for immunocompromised state.  Neurological: Negative for syncope, weakness and headaches.  All other systems reviewed and are negative.    Physical Exam Updated Vital Signs BP 130/80 (BP Location: Right Arm)   Pulse (!) 59   Temp 98.3 F (36.8 C) (Oral)   Resp 18   Ht 5\' 3"  (1.6 m)   Wt 125 lb 12.8 oz (57.1 kg)   LMP 07/21/2016 (Within Days)   SpO2 99%   BMI 22.28 kg/m   Physical Exam  Constitutional: She is oriented to person, place, and time. She appears well-developed and well-nourished. No distress.  HENT:  Head: Normocephalic.  Vesicular, crusted rash across entire left V1 distribution with erythematous base.  Lesions with multiple areas of excoriation and healing. No involvement of eyelid or external auditory canal.  Eyes: Conjunctivae are normal.  On fluorescein staining there is no corneal uptake or dendritic lesions. Pupils are equal and round. No conjunctival injection or erythema. No ecchymosis.  Neck: Neck supple.  Cardiovascular: Normal rate, regular rhythm and normal heart sounds.  Exam reveals no friction rub.   No murmur heard. Pulmonary/Chest: Effort normal and breath sounds normal. No respiratory distress. She has no wheezes. She has no rales.  Abdominal: She exhibits no distension.  Musculoskeletal: She exhibits no edema.  Neurological: She is alert and oriented to person, place, and time. She exhibits normal muscle tone.  Skin: Skin is warm. Capillary refill takes less than 2 seconds.  Psychiatric: She has a normal mood and affect.  Nursing note and vitals reviewed.  ED Treatments / Results  Labs (all labs ordered are listed, but only abnormal results are displayed) Labs Reviewed  CBC WITH DIFFERENTIAL/PLATELET - Abnormal; Notable for the following:       Result Value   Platelets 106 (*)    All other components within normal limits  BASIC METABOLIC PANEL - Abnormal; Notable for the following:    Glucose, Bld 108 (*)    BUN 5 (*)    All other components within normal limits  HEPATIC FUNCTION PANEL - Abnormal; Notable for the following:    ALT 11 (*)    All other components within normal limits  CBC  COMPREHENSIVE METABOLIC PANEL  I-STAT CG4 LACTIC ACID, ED    EKG  EKG Interpretation None       Radiology No results found.  Procedures Procedures (including critical care time)  Medications Ordered in ED Medications  amoxicillin (AMOXIL) capsule 500 mg (not administered)  buPROPion (WELLBUTRIN XL) 24 hr tablet 150 mg (not administered)  methimazole (TAPAZOLE) tablet 5 mg (not administered)  pantoprazole (PROTONIX) EC tablet 40 mg (not administered)    HYDROcodone-acetaminophen (NORCO/VICODIN) 5-325 MG per tablet 1-2 tablet (not administered)  0.9 %  sodium chloride infusion (not administered)  acetaminophen (TYLENOL) tablet 650 mg (not administered)    Or  acetaminophen (TYLENOL) suppository 650 mg (not administered)  ondansetron (ZOFRAN) tablet 4 mg (not administered)    Or  ondansetron (ZOFRAN) injection 4 mg (not administered)  HYDROmorphone (DILAUDID) injection 0.5 mg (0.5 mg Intravenous Given 07/28/16 2111)  predniSONE (DELTASONE) tablet 60 mg (not administered)  acyclovir (ZOVIRAX) 590 mg in dextrose 5 % 100 mL IVPB (not administered)  erythromycin ophthalmic ointment (not administered)  HYDROmorphone (DILAUDID) injection 1 mg (1 mg Intravenous Given 07/28/16 1557)  sodium chloride 0.9 % bolus 1,000 mL (0 mLs Intravenous Stopped 07/28/16 1927)  tetracaine (PONTOCAINE) 0.5 % ophthalmic solution 2 drop (2 drops Both Eyes Given 07/28/16 1559)  fluorescein ophthalmic strip 1 strip (1 strip Both Eyes Given 07/28/16 1559)  oxyCODONE-acetaminophen (PERCOCET/ROXICET) 5-325 MG per tablet 2 tablet (2 tablets Oral Given 07/28/16 1813)  HYDROmorphone (DILAUDID) injection 1 mg (1 mg Intravenous Given 07/28/16 1924)     Initial Impression / Assessment and Plan / ED Course  I have reviewed the triage vital signs and the nursing notes.  Pertinent labs & imaging results that were available during my care of the patient were reviewed by me and considered in my medical decision making (see chart for details).  Clinical Course   40 yo F with PMHx of HTN, uterine CA who presents with left facial zoster. Seen 9/5 with similar sx and discharged home, back with unrelenting pain. On arrival, pt has severe shingles rash to left hemiface. No drainage or signs of secondary superinfection. No involvement of EAC. Pt was just seen/cleared by Ophtho and on my exam today, pt has intact vision, no ocular erythema/conjunctival erythema, and no evidence of dendritic lesion to  suggest herpes ophthalmicus. No vision changes. Given severe, persistent pain, feel pt will need admission. Discussed with Dr. Eulas Post who is in agreement. I also discussed pt's case with Dr. Posey Pronto of Ophtho, who saw pt yesterday and does not feel ocular involvement is likely. Will admit for IV analgesia, antivirals.  Final Clinical Impressions(s) / ED Diagnoses   Final diagnoses:  Shingles outbreak    New Prescriptions Current Discharge Medication List       Duffy Bruce, MD 07/29/16 [phone]

## 2016-07-28 NOTE — H&P (Signed)
History and Physical    Alyssa Shaffer [id number] DOB: 31-May-1976 DOA: 07/28/2016  PCP: No PCP Per Patient   Patient coming from: Home  Chief Complaint: Progressive face pain, swelling, blurred vision  HPI: Alyssa Shaffer is a 40 y.o. woman with a history of cervical cancer and HTN who presented to the ED on 9/5 for evaluation of left pain associated with a vesicular rash, that had been present for almost one week prior that.  She has diagnosed with herpes zoster ophthalmicus.  She was given prescriptions for oral valtrex, amoxicillin, prednisone taper, and hydrocodone for pain.  She followed up with the ophthamologist yesterday as recommended.  Per verbal report from the ED, there were no significant findings on exam to suggest complication from zoster.  Erythromycin ointment BID was recommended, in addition to the other therapies that were already prescribed.  The patient returned to the ED tonight complaining of intractable face pain.  She complains of blurred vision, but no vision loss.  She denies ear pain.  She does not have vesicles in her left ear.  Her vesicular rash is stable, but she has progressive swelling and tightness in the left side of her face that is now progressing toward the right.  She can still open and close her left eye voluntarily but her eye lid is swollen and drooping slightly.  Pain 10 out of 10 on presentation; improved after multiple doses of IV narcotics.   Reportedly, her mother also has an active shingles outbreak right now.  ED Course: Analgesics given.  The patient is in airborne precautions due to the diagnosis of zoster.  Hospitalist asked to admit for intractable pain and further management.  Review of Systems: As per HPI otherwise 10 point review of systems negative.    Past Medical History:  Diagnosis Date  . Cancer ([place])   . Cyst of fallopian tube 08/02/2013  . Hypertension     Past Surgical History:  Procedure Laterality  Date  . CERVIX REMOVAL     partial  . TONSILLECTOMY    . TUBAL LIGATION       reports that she has been smoking.  She has a 27.00 pack-year smoking history. She has never used smokeless tobacco. She reports that she drinks alcohol. She reports that she does not use drugs.  Allergies  Allergen Reactions  . Bee Venom Anaphylaxis    Throat closure   . Meloxicam Hives and Nausea And Vomiting    Family History  Problem Relation Age of Onset  . Heart disease Mother   . Seizures Mother   . Hypertension Father   . Heart disease Father      Prior to Admission medications   Medication Sig Start Date End Date Taking? Authorizing Provider  amoxicillin (AMOXIL) 500 MG capsule Take 1 capsule (500 mg total) by mouth 3 (three) times daily. 07/26/16   Olivia Canter Sam, PA-C  amoxicillin (AMOXIL) 500 MG tablet Take 1 tablet (500 mg total) by mouth 2 (two) times daily. Patient not taking: Reported on 10/03/2014 04/09/14   Margarita Mail, PA-C  buPROPion (WELLBUTRIN XL) 150 MG 24 hr tablet Take 150 mg by mouth daily. 10/05/15   Historical Provider, MD  ciprofloxacin-dexamethasone (CIPRODEX) otic suspension Place 4 drops into the right ear 2 (two) times daily. X 7 days Patient not taking: Reported on 10/23/2015 10/03/14   Baron Sane, PA-C  HYDROcodone-acetaminophen (NORCO/VICODIN) 5-325 MG tablet Take 1-2 tablets by mouth every 4 (four) hours as needed. 07/26/16  Olivia Canter Sam, PA-C  ibuprofen (ADVIL,MOTRIN) 800 MG tablet Take 1 tablet (800 mg total) by mouth 3 (three) times daily. Patient not taking: Reported on 10/23/2015 10/03/14   Baron Sane, PA-C  methimazole (TAPAZOLE) 5 MG tablet Take 5 mg by mouth 3 (three) times daily. 10/08/15   Historical Provider, MD  metroNIDAZOLE (FLAGYL) 500 MG tablet Take 1 tablet (500 mg total) by mouth 2 (two) times daily. Patient not taking: Reported on 10/03/2014 11/05/13   Lahoma Crocker, MD  omeprazole (PRILOSEC) 20 MG capsule Take 20 mg by mouth  daily. 08/06/15   Historical Provider, MD  ondansetron (ZOFRAN ODT) 4 MG disintegrating tablet Take 1 tablet (4 mg total) by mouth every 8 (eight) hours as needed for nausea or vomiting. Patient not taking: Reported on 10/23/2015 10/03/14   Baron Sane, PA-C  oxyCODONE-acetaminophen (PERCOCET) 10-325 MG tablet Take 1 tablet by mouth every 6 (six) hours. for pain 09/09/15   Historical Provider, MD  oxyCODONE-acetaminophen (PERCOCET/ROXICET) 5-325 MG per tablet Take 1 tablet by mouth every 8 (eight) hours as needed for severe pain. May take 2 tablets PO q 6 hours for severe pain - Do not take with Tylenol as this tablet already contains tylenol Patient not taking: Reported on 10/23/2015 10/03/14   Baron Sane, PA-C  predniSONE (STERAPRED UNI-PAK 21 TAB) 10 MG (21) TBPK tablet Take 1 tablet (10 mg total) by mouth daily. Take 6 tabs by mouth daily  for 1 days, then 5 tabs for 1 day, then 4 tabs for 1 days, then 3 tabs for 1 days, 2 tabs for 1 days, then 1 tab by mouth for 1 day 07/26/16   Olivia Canter Sam, PA-C  traMADol (ULTRAM) 50 MG tablet Take 1 tablet (50 mg total) by mouth every 6 (six) hours as needed. Patient not taking: Reported on 10/03/2014 10/11/13   Timmothy Euler, MD  valACYclovir (VALTREX) 1000 MG tablet Take 1 tablet (1,000 mg total) by mouth 3 (three) times daily. 07/26/16 08/09/16  Anne Ng, PA-C    Physical Exam: Vitals:   07/28/16 1715 07/28/16 1802 07/28/16 1924 07/28/16 2000  BP: 125/77 114/94 114/82 127/76  Pulse: (!) 49 66 (!) 58 (!) 52  Resp:  16    Temp:      TempSrc:      SpO2: 100% 100% 100% 94%      Constitutional: NAD, calm, cooperative Vitals:   07/28/16 1715 07/28/16 1802 07/28/16 1924 07/28/16 2000  BP: 125/77 114/94 114/82 127/76  Pulse: (!) 49 66 (!) 58 (!) 52  Resp:  16    Temp:      TempSrc:      SpO2: 100% 100% 100% 94%   Eyes: PERRL, periorbital swelling bilaterally but worse on the left.  Left lid drooping but she still has some  voluntarily control. ENMT: Mucous membranes are moist. Posterior pharynx clear of any exudate or lesions. Normal dentition.  Neck: normal appearance, supple Respiratory: clear to auscultation bilaterally, no wheezing, no crackles. Normal respiratory effort. No accessory muscle use.  Cardiovascular: Normal rate, regular rhythm, no murmurs / rubs / gallops. No extremity edema. 2+ pedal pulses. GI: abdomen is soft and compressible.  No distention.  No tenderness.  Bowel sounds are present. Musculoskeletal:  No joint deformity in upper and lower extremities. Good ROM, no contractures. Normal muscle tone.  Skin: Vesicular rash to left side of her face, from scalp line to just below her eye; stops at the midline.  No other rashes.  No vesicles in her left ear canal. Neurologic: CN 2-12 grossly, limitations as noted above.  Snsation intact, Strength symmetric bilaterally, 5/5 in upper and lower extremities. Psychiatric: Normal judgment and insight. Alert and oriented x 3. Normal mood.     Labs on Admission: I have personally reviewed following labs and imaging studies  CBC:  Recent Labs Lab 07/28/16 1558  WBC 4.8  NEUTROABS 2.3  HGB 13.8  HCT 41.2  MCV 89.6  PLT [id number]*   Basic Metabolic Panel:  Recent Labs Lab 07/28/16 1558  NA 139  K 4.2  CL 107  CO2 25  GLUCOSE 108*  BUN 5*  CREATININE 0.79  CALCIUM 9.3   GFR: Estimated Creatinine Clearance: 77.3 mL/min (by C-G formula based on SCr of 0.8 mg/dL).  Assessment/Plan Principal Problem:   HZV (herpes zoster virus) with ophthalmic complication Active Problems:   Intractable pain   HTN (hypertension)   Zoster ophthalmicus      Herpes zoster ophthalmicus with vision changes and intractable pain.  She does not have complete facial paralysis or apparent auditory involvement at this time.  Case discussed with Dr. Michel Bickers of ID by phone. --Airborne precautions. --Will treat with IV acyclovir, pharmacy to dose, while  inpatient.  Can transition back to oral valtrex at discharge. --Prednisone 60mg  po daily (no indication for IV steroids at this time) --Analgesics as needed for pain --Dr. Megan Salon will ask a colleague to perform formal ID consultation in the AM --Empiric amoxicillin as previously prescribed to cover potential superimposed bacterial infection --Erythromycin ointment to left eye BID as recommended by ophthamology  The patient is admitted to inpatient status due to concern for progressive neurological deficits related to herpes zoster (high risk location when head/neck involvement).   DVT prophylaxis: SCDs Code Status: FULL Family Communication: Patient alone in the ED at time of admission. Disposition Plan: Expect she will go home when stable. Consults called: Infectious Disease, She can follow up with ophthamology in clinic if she remains stable. Admission status: Inpatient, med surg   TIME SPENT: 62 minutes   Eber Jones MD [facility] Pager [phone]  If 7PM-7AM, please contact night-coverage [email or web address] Password [email or web address]  07/28/2016, 8:30 PM

## 2016-07-28 NOTE — ED Notes (Signed)
Called patient to recheck vitals patient did not answer

## 2016-07-28 NOTE — Progress Notes (Signed)
Pharmacy Antibiotic Note  Alyssa Shaffer is a 40 y.o. female admitted on 07/28/2016 with facial rash and ear pain. Pharmacy has been consulted for acyclovir dosing for herpes zoster with vision changes. Intravenous fluids at 75 mL/hr ordered per provider. Afebrile and WBC normal.   Plan: Acyclovir 10mg /kg IV every 8 hours NS at 75 mL/hr per provider Monitor renal function and clinical picture  Follow-up ophthalmology consult   Temp (24hrs), Avg:98.5 F (36.9 C), Min:98.5 F (36.9 C), Max:98.5 F (36.9 C)   Recent Labs Lab 07/28/16 1558 07/28/16 1607  WBC 4.8  --   CREATININE 0.79  --   LATICACIDVEN  --  0.70    Estimated Creatinine Clearance: 77.3 mL/min (by C-G formula based on SCr of 0.8 mg/dL).    Allergies  Allergen Reactions  . Bee Venom Anaphylaxis    Throat closure   . Meloxicam Hives and Nausea And Vomiting    Antimicrobials this admission: 9/7 Acyclovir >>   Dose adjustments this admission: N/A  Microbiology results: None ordered   Thank you for allowing pharmacy to be a part of this patient's care.  Argie Ramming, PharmD Pharmacy Resident  Pager [phone] 07/28/16 8:56 PM

## 2016-07-28 NOTE — ED Notes (Signed)
Dr. Roderic Palau made aware of pt's pain 10/10. No new orders received.

## 2016-07-28 NOTE — ED Notes (Signed)
Patient states she was seen by eye doctor yesterday also.

## 2016-07-29 DIAGNOSIS — R52 Pain, unspecified: Secondary | ICD-10-CM

## 2016-07-29 DIAGNOSIS — B028 Zoster with other complications: Secondary | ICD-10-CM

## 2016-07-29 DIAGNOSIS — B029 Zoster without complications: Secondary | ICD-10-CM

## 2016-07-29 DIAGNOSIS — I10 Essential (primary) hypertension: Secondary | ICD-10-CM

## 2016-07-29 LAB — COMPREHENSIVE METABOLIC PANEL
ALBUMIN: 3.5 g/dL (ref 3.5–5.0)
ALT: 11 U/L — AB (ref 14–54)
AST: 12 U/L — ABNORMAL LOW (ref 15–41)
Alkaline Phosphatase: 53 U/L (ref 38–126)
Anion gap: 8 (ref 5–15)
BUN: 6 mg/dL (ref 6–20)
CHLORIDE: 106 mmol/L (ref 101–111)
CO2: 25 mmol/L (ref 22–32)
CREATININE: 0.85 mg/dL (ref 0.44–1.00)
Calcium: 8.9 mg/dL (ref 8.9–10.3)
GFR calc non Af Amer: >60 mL/min (ref >60)
GLUCOSE: 110 mg/dL — AB (ref 65–99)
Potassium: 4.4 mmol/L (ref 3.5–5.1)
SODIUM: 139 mmol/L (ref 135–145)
Total Bilirubin: 0.7 mg/dL (ref 0.3–1.2)
Total Protein: 6 g/dL — ABNORMAL LOW (ref 6.5–8.1)

## 2016-07-29 LAB — CBC
HCT: 37.1 % (ref 36.0–46.0)
HEMOGLOBIN: 12.1 g/dL (ref 12.0–15.0)
MCH: 29.5 pg (ref 26.0–34.0)
MCHC: 32.6 g/dL (ref 30.0–36.0)
MCV: 90.5 fL (ref 78.0–100.0)
PLATELETS: 103 10*3/uL — AB (ref 150–400)
RBC: 4.1 MIL/uL (ref 3.87–5.11)
RDW: 14.7 % (ref 11.5–15.5)
WBC: 5.2 10*3/uL (ref 4.0–10.5)

## 2016-07-29 MED ORDER — DEXTROSE 5 % IV SOLN
10.0000 mg/kg | Freq: Three times a day (TID) | INTRAVENOUS | Status: DC
  Administered 2016-07-29 – 2016-07-31 (×5): 570 mg via INTRAVENOUS
  Filled 2016-07-29 (×9): qty 11.4

## 2016-07-29 MED ORDER — OXYCODONE-ACETAMINOPHEN 5-325 MG PO TABS
2.0000 | ORAL_TABLET | ORAL | Status: DC | PRN
Start: 2016-07-29 — End: 2016-07-31
  Administered 2016-07-29 – 2016-07-31 (×8): 2 via ORAL
  Filled 2016-07-29 (×8): qty 2

## 2016-07-29 MED ORDER — HYDROMORPHONE HCL 1 MG/ML IJ SOLN
1.0000 mg | INTRAMUSCULAR | Status: DC | PRN
  Administered 2016-07-29: 1 mg via INTRAVENOUS
  Filled 2016-07-29: qty 1

## 2016-07-29 NOTE — Progress Notes (Signed)
[facility] for Infectious Disease  Date of Admission:  07/28/2016  Date of Consult:  07/29/2016  Reason for Consult: Herpes Zoster Referring Physician: Algis Liming  Impression/Recommendation Herpes Zoster  Would continue VI acyclovir Would continue pain mgmt Would continue amoxil She needs ophtho f/u  Comment- She does not appear to have celluilitis at this time. Her swelling is more consistent with edema, minimal erythema, and is bilateral. Her blisters are crusting which is encouraging.   Dr Megan Salon available if questions over the weekend.   Thank you so much for this interesting consult,   Bobby Rumpf (pager) [phone] [email or web address]  Alyssa Shaffer is an 40 y.o. female.  HPI: 40 yo F with hx of cervical CA, came to ED on 9-5 with 1 week hx of L facial vesicular rash. She was started on valtrex, Amox, prednisone. She was seen by ophtho and was felt not to have significant findings, started on topical erythro.  She returned to ED on 9-7 with worsening facial pain, blurry vision.  She was started on po steroids (prednisone 6m), IV acyclovir and continued on her amoxil.   She complains of continued pain, believes she has new vesicles on her L nasal area, periorbital swelling bilaterally.   Past Medical History:  Diagnosis Date  [place]   . Cyst of fallopian tube 08/02/2013  . Hypertension     Past Surgical History:  Procedure Laterality Date  . CERVIX REMOVAL     partial  . TONSILLECTOMY    . TUBAL LIGATION       Allergies  Allergen Reactions  . Bee Venom Anaphylaxis    Throat closure   . Meloxicam Hives and Nausea And Vomiting    Medications:  Scheduled: . acyclovir  10 mg/kg Intravenous Q8H  . amoxicillin  500 mg Oral TID  . buPROPion  150 mg Oral Daily  . erythromycin   Left Eye BID  . methimazole  5 mg Oral TID  . pantoprazole  40 mg Oral Daily  . predniSONE  60 mg Oral Q breakfast    Abtx:  Anti-infectives      Start     Dose/Rate Route Frequency Ordered Stop   07/29/16 2200  acyclovir (ZOVIRAX) 570 mg in dextrose 5 % 100 mL IVPB     10 mg/kg  57.1 kg 111.4 mL/hr over 60 Minutes Intravenous Every 8 hours 07/29/16 1302     07/28/16 2230  amoxicillin (AMOXIL) capsule 500 mg     500 mg Oral 3 times daily 07/28/16 2056     07/28/16 2200  acyclovir (ZOVIRAX) 590 mg in dextrose 5 % 100 mL IVPB     10 mg/kg  59 kg 111.8 mL/hr over 60 Minutes Intravenous Every 8 hours 07/28/16 2050 07/29/16 1600      Total days of antibiotics: 3 acyclovir, amoxil          Social History:  reports that she has been smoking.  She has a 27.00 pack-year smoking history. She has never used smokeless tobacco. She reports that she drinks alcohol. She reports that she does not use drugs.  Family History  Problem Relation Age of Onset  . Heart disease Mother   . Seizures Mother   . Hypertension Father   . Heart disease Father     General ROS: blurry vision L eye, no dysphagia, no oral ulcers, see HPI.   Blood pressure 138/67, pulse (!) 51, temperature 98.7 F (37.1 C), temperature source Oral, resp. rate 18,  height 5' 3"  (1.6 m), weight 57.1 kg (125 lb 12.8 oz), last menstrual period 07/21/2016, SpO2 98 %. General appearance: alert, cooperative and no distress Head: vesicles extending into hair line on L scalp.  Eyes: negative findings: conjunctivae and sclerae normal, pupils equal, round, reactive to light and accomodation and crusted vsicles around L eye. no fresh blisters. Throat: normal findings: oropharynx pink & moist without lesions or evidence of thrush Neck: no adenopathy and supple, symmetrical, trachea midline Back: dressing on L upper back.  Lungs: clear to auscultation bilaterally Heart: regular rate and rhythm Abdomen: normal findings: bowel sounds normal and soft, non-tender Extremities: edema none   Results for orders placed or performed during the hospital encounter of 07/28/16 (from the past  48 hour(s))  CBC with Differential     Status: Abnormal   Collection Time: 07/28/16  3:58 PM  Result Value Ref Range   WBC 4.8 4.0 - 10.5 K/uL   RBC 4.60 3.87 - 5.11 MIL/uL   Hemoglobin 13.8 12.0 - 15.0 g/dL   HCT 41.2 36.0 - 46.0 %   MCV 89.6 78.0 - 100.0 fL   MCH 30.0 26.0 - 34.0 pg   MCHC 33.5 30.0 - 36.0 g/dL   RDW 14.7 11.5 - 15.5 %   Platelets 106 (L) 150 - 400 K/uL    Comment: REPEATED TO VERIFY SPECIMEN CHECKED FOR CLOTS PLATELET COUNT CONFIRMED BY SMEAR    Neutrophils Relative % 46 %   Lymphocytes Relative 37 %   Monocytes Relative 15 %   Eosinophils Relative 1 %   Basophils Relative 1 %   Neutro Abs 2.3 1.7 - 7.7 K/uL   Lymphs Abs 1.8 0.7 - 4.0 K/uL   Monocytes Absolute 0.7 0.1 - 1.0 K/uL   Eosinophils Absolute 0.0 0.0 - 0.7 K/uL   Basophils Absolute 0.0 0.0 - 0.1 K/uL   Smear Review MORPHOLOGY UNREMARKABLE   Basic metabolic panel     Status: Abnormal   Collection Time: 07/28/16  3:58 PM  Result Value Ref Range   Sodium 139 135 - 145 mmol/L   Potassium 4.2 3.5 - 5.1 mmol/L   Chloride 107 101 - 111 mmol/L   CO2 25 22 - 32 mmol/L   Glucose, Bld 108 (H) 65 - 99 mg/dL   BUN 5 (L) 6 - 20 mg/dL   Creatinine, Ser 0.79 0.44 - 1.00 mg/dL   Calcium 9.3 8.9 - 10.3 mg/dL   GFR calc non Af Amer >60 >60 mL/min   GFR calc Af Amer >60 >60 mL/min    Comment: (NOTE) The eGFR has been calculated using the CKD EPI equation. This calculation has not been validated in all clinical situations. eGFR's persistently <60 mL/min signify possible Chronic Kidney Disease.    Anion gap 7 5 - 15  Hepatic function panel     Status: Abnormal   Collection Time: 07/28/16  3:58 PM  Result Value Ref Range   Total Protein 7.0 6.5 - 8.1 g/dL   Albumin 4.0 3.5 - 5.0 g/dL   AST 16 15 - 41 U/L   ALT 11 (L) 14 - 54 U/L   Alkaline Phosphatase 59 38 - 126 U/L   Total Bilirubin 0.6 0.3 - 1.2 mg/dL   Bilirubin, Direct 0.1 0.1 - 0.5 mg/dL   Indirect Bilirubin 0.5 0.3 - 0.9 mg/dL  I-Stat CG4 Lactic  Acid, ED     Status: None   Collection Time: 07/28/16  4:07 PM  Result Value Ref Range  Lactic Acid, Venous 0.70 0.5 - 1.9 mmol/L  CBC     Status: Abnormal   Collection Time: 07/29/16  5:49 AM  Result Value Ref Range   WBC 5.2 4.0 - 10.5 K/uL   RBC 4.10 3.87 - 5.11 MIL/uL   Hemoglobin 12.1 12.0 - 15.0 g/dL   HCT 37.1 36.0 - 46.0 %   MCV 90.5 78.0 - 100.0 fL   MCH 29.5 26.0 - 34.0 pg   MCHC 32.6 30.0 - 36.0 g/dL   RDW 14.7 11.5 - 15.5 %   Platelets 103 (L) 150 - 400 K/uL    Comment: CONSISTENT WITH PREVIOUS RESULT  Comprehensive metabolic panel     Status: Abnormal   Collection Time: 07/29/16  5:49 AM  Result Value Ref Range   Sodium 139 135 - 145 mmol/L   Potassium 4.4 3.5 - 5.1 mmol/L   Chloride 106 101 - 111 mmol/L   CO2 25 22 - 32 mmol/L   Glucose, Bld 110 (H) 65 - 99 mg/dL   BUN 6 6 - 20 mg/dL   Creatinine, Ser 0.85 0.44 - 1.00 mg/dL   Calcium 8.9 8.9 - 10.3 mg/dL   Total Protein 6.0 (L) 6.5 - 8.1 g/dL   Albumin 3.5 3.5 - 5.0 g/dL   AST 12 (L) 15 - 41 U/L   ALT 11 (L) 14 - 54 U/L   Alkaline Phosphatase 53 38 - 126 U/L   Total Bilirubin 0.7 0.3 - 1.2 mg/dL   GFR calc non Af Amer >60 >60 mL/min   GFR calc Af Amer >60 >60 mL/min    Comment: (NOTE) The eGFR has been calculated using the CKD EPI equation. This calculation has not been validated in all clinical situations. eGFR's persistently <60 mL/min signify possible Chronic Kidney Disease.    Anion gap 8 5 - 15   No results found for: SDES, SPECREQUEST, CULT, REPTSTATUS No results found. No results found for this or any previous visit (from the past 240 hour(s)).    07/29/2016, 4:43 PM     LOS: 1 day    Records and images were personally reviewed where available.

## 2016-07-29 NOTE — Progress Notes (Addendum)
PROGRESS NOTE  Alyssa Shaffer  [id number] DOB: December 19, 1975  DOA: 07/28/2016 PCP: No PCP Per Patient   Brief Narrative:  40 year old female with history of cervical cancer and HTN presented to the ED on 9/5 for evaluation of left side of forehead/scalp pain and a vesicular rash that had been present for a week prior. She was diagnosed with herpes zoster ophthalmicus, prescribed oral Valtrex, amoxicillin, prednisone taper, hydrocodone for pain. She followed up with the ophthalmologist on 9/6 and as per report, there were no significant findings on exam to suggest complication from zoster but erythromycin ointment was recommended in addition to other therapies prescribed. She'll return to ED on 9/7 complaining of intractable facial pain, blurred vision but no vision loss or ear pain. She noted progressive swelling and tightness of left side of her face followed by progression towards the right. Admitted for management of shingles. ID consulted 9/8.   Assessment & Plan:   Principal Problem:   HZV (herpes zoster virus) with ophthalmic complication Active Problems:   Intractable pain   HTN (hypertension)   Zoster ophthalmicus   Herpes zoster - Infectious disease consultation 9/8 appreciated. Recommend continuing IV acyclovir, pain management and amoxicillin and recommend ophthalmology follow-up. She does not appear to have cellulitis at this time and her swelling is more consistent with edema and her blisters are crusting which is encouraging. - Pain management - Continue airborne and contact precautions.  Hypothyroidism - Patient had been started on methimazole but on reviewing of home medications, it appears that she has not been taking methimazole but has been on Synthroid. Check TSH. Verify with patient in a.m. prior to starting Synthroid.  Essential hypertension - Controlled.    Chronic thrombocytopenia  - Stable.    DVT prophylaxis: SCDs Code Status: Full Family  Communication: None at bedside Disposition Plan: DC home when medically improved.   Consultants:   ID  Procedures:   None  Antimicrobials:   IV Acyclovir  Amoxicillin    Subjective: Complaints of pain at site of rash. Indicates that Vicodin does not work well for her but Percocet does and states that she had some at home that she had been taking prior to admission. Swelling around eyes has improved. No other complaints reported. Did not report blurry vision, double vision or any other visual symptoms. No earache.  Objective:  Vitals:   07/28/16 2000 07/28/16 2105 07/29/16 0510 07/29/16 1459  BP: 127/76 130/80 133/68 138/67  Pulse: (!) 52 (!) 59 (!) 50 (!) 51  Resp:  18 18 18   Temp:  98.3 F (36.8 C) 98.6 F (37 C) 98.7 F (37.1 C)  TempSrc:  Oral Oral Oral  SpO2: 94% 99% 97% 98%  Weight:  57.1 kg (125 lb 12.8 oz)    Height:  5\' 3"  (1.6 m)      Intake/Output Summary (Last 24 hours) at 07/29/16 1818 Last data filed at 07/29/16 0230  Gross per 24 hour  Intake                0 ml  Output              100 ml  Net             -100 ml   Filed Weights   07/28/16 2105  Weight: 57.1 kg (125 lb 12.8 oz)    Examination:  General exam: Pleasant young female seen ambulating comfortably in the room. Intermittently in mild painful distress.  HEENT: Crusting vesicular rash extending  from mid lying forehead to left side temporal region. Unable to clearly determine rash on scalp due to hair. No lesions seen in the year.  Eyes: Except mild periorbital edema, no acute findings. Pupils equally reacting to light and accommodation. No photophobia.  Respiratory system: Clear to auscultation. Respiratory effort normal. Cardiovascular system: S1 & S2 heard, RRR. No JVD, murmurs, rubs, gallops or clicks. No pedal edema. Gastrointestinal system: Abdomen is nondistended, soft and nontender. No organomegaly or masses felt. Normal bowel sounds heard. Central nervous system: Alert and  oriented. No focal neurological deficits. Extremities: Symmetric 5 x 5 power. Skin: No rashes, lesions or ulcers Psychiatry: Judgement and insight appear normal. Mood & affect appropriate.     Data Reviewed: I have personally reviewed following labs and imaging studies  CBC:  Recent Labs Lab 07/28/16 1558 07/29/16 0549  WBC 4.8 5.2  NEUTROABS 2.3  --   HGB 13.8 12.1  HCT 41.2 37.1  MCV 89.6 90.5  PLT 106* [id number]*   Basic Metabolic Panel:  Recent Labs Lab 07/28/16 1558 07/29/16 0549  NA 139 139  K 4.2 4.4  CL 107 106  CO2 25 25  GLUCOSE 108* 110*  BUN 5* 6  CREATININE 0.79 0.85  CALCIUM 9.3 8.9   GFR: Estimated Creatinine Clearance: 72.8 mL/min (by C-G formula based on SCr of 0.85 mg/dL). Liver Function Tests:  Recent Labs Lab 07/28/16 1558 07/29/16 0549  AST 16 12*  ALT 11* 11*  ALKPHOS 59 53  BILITOT 0.6 0.7  PROT 7.0 6.0*  ALBUMIN 4.0 3.5   No results for input(s): LIPASE, AMYLASE in the last 168 hours. No results for input(s): AMMONIA in the last 168 hours. Coagulation Profile: No results for input(s): INR, PROTIME in the last 168 hours. Cardiac Enzymes: No results for input(s): CKTOTAL, CKMB, CKMBINDEX, TROPONINI in the last 168 hours. BNP (last 3 results) No results for input(s): PROBNP in the last 8760 hours. HbA1C: No results for input(s): HGBA1C in the last 72 hours. CBG: No results for input(s): GLUCAP in the last 168 hours. Lipid Profile: No results for input(s): CHOL, HDL, LDLCALC, TRIG, CHOLHDL, LDLDIRECT in the last 72 hours. Thyroid Function Tests: No results for input(s): TSH, T4TOTAL, FREET4, T3FREE, THYROIDAB in the last 72 hours. Anemia Panel: No results for input(s): VITAMINB12, FOLATE, FERRITIN, TIBC, IRON, RETICCTPCT in the last 72 hours.  Sepsis Labs:  Recent Labs Lab 07/28/16 1607  LATICACIDVEN 0.70    No results found for this or any previous visit (from the past 240 hour(s)).       Radiology Studies: No  results found.      Scheduled Meds: . acyclovir  10 mg/kg Intravenous Q8H  . amoxicillin  500 mg Oral TID  . buPROPion  150 mg Oral Daily  . erythromycin   Left Eye BID  . pantoprazole  40 mg Oral Daily  . predniSONE  60 mg Oral Q breakfast   Continuous Infusions:     LOS: 1 day    Time spent: 60 minutes.    [facility], MD [facility] Pager [phone]  If 7PM-7AM, please contact night-coverage [email or web address] Password [email or web address] 07/29/2016, 6:18 PM

## 2016-07-29 NOTE — Progress Notes (Signed)
Pharmacy Antibiotic Note  Opaline Georgieann Dorminey is a 40 y.o. female admitted on 07/28/2016 with facial rash and ear pain. Pharmacy has been consulted for acyclovir dosing for herpes zoster with vision changes. Patient's renal function remains stable; however, her weight has been updated.  Plan: Acyclovir 570mg  IV Q8H (10 mg/kg) NS at 75 mL/hr per provider Monitor renal function and clinical picture  Follow-up ophthalmology consult   Temp (24hrs), Avg:98.5 F (36.9 C), Min:98.3 F (36.8 C), Max:98.6 F (37 C)   Recent Labs Lab 07/28/16 1558 07/28/16 1607 07/29/16 0549  WBC 4.8  --  5.2  CREATININE 0.79  --  0.85  LATICACIDVEN  --  0.70  --     Estimated Creatinine Clearance: 72.8 mL/min (by C-G formula based on SCr of 0.85 mg/dL).    Allergies  Allergen Reactions  . Bee Venom Anaphylaxis    Throat closure   . Meloxicam Hives and Nausea And Vomiting    Antimicrobials this admission: 9/7 Acyclovir >>   Dose adjustments this admission:  N/A  Microbiology results:  N/A   Ezabella Teska D. Mina Marble, PharmD, BCPS Pager:  [phone] 07/29/2016, 1:03 PM

## 2016-07-30 LAB — HIV ANTIBODY (ROUTINE TESTING W REFLEX): HIV SCREEN 4TH GENERATION: NONREACTIVE

## 2016-07-30 LAB — TSH: TSH: 2.643 u[IU]/mL (ref 0.350–4.500)

## 2016-07-30 MED ORDER — LEVOTHYROXINE SODIUM 25 MCG PO TABS
25.0000 ug | ORAL_TABLET | Freq: Two times a day (BID) | ORAL | Status: DC
Start: 2016-07-30 — End: 2016-07-30

## 2016-07-30 MED ORDER — METHIMAZOLE 5 MG PO TABS
5.0000 mg | ORAL_TABLET | Freq: Two times a day (BID) | ORAL | Status: DC
  Administered 2016-07-30 – 2016-07-31 (×2): 5 mg via ORAL
  Filled 2016-07-30 (×2): qty 1

## 2016-07-30 NOTE — Progress Notes (Signed)
PROGRESS NOTE  Alyssa Shaffer  [id number] DOB: [birth year]  DOA: 07/28/2016 PCP: No PCP Per Patient   Brief Narrative:  40 year old female with history of cervical cancer and HTN presented to the ED on 9/5 for evaluation of left side of forehead/scalp pain and a vesicular rash that had been present for a week prior. She was diagnosed with herpes zoster ophthalmicus, prescribed oral Valtrex, amoxicillin, prednisone taper, hydrocodone for pain. She followed up with the ophthalmologist on 9/6 and as per report, there were no significant findings on exam to suggest complication from zoster but erythromycin ointment was recommended in addition to other therapies prescribed. She'll return to ED on 9/7 complaining of intractable facial pain, blurred vision but no vision loss or ear pain. She noted progressive swelling and tightness of left side of her face followed by progression towards the right. Admitted for management of shingles. ID consulted 9/8.   Assessment & Plan:   Principal Problem:   HZV (herpes zoster virus) with ophthalmic complication Active Problems:   Intractable pain   HTN (hypertension)   Zoster ophthalmicus   Herpes zoster - Infectious disease consultation 9/8 appreciated. Recommend continuing IV acyclovir, pain management and amoxicillin and recommend ophthalmology follow-up. She does not appear to have cellulitis at this time and her swelling is more consistent with edema and her blisters are crusting which is encouraging. - Pain management - Continue airborne and contact precautions. - Clinically much improved. Continue additional day of IV acyclovir and consider discharging home 9/10.  Hypothyroidism - Patient had been started on methimazole but on reviewing of home medications, it appears that she has not been taking methimazole but has been on Synthroid. Check TSH. Verify with patient in a.m. prior to starting Synthroid. - TSH normal.  Essential  hypertension - Controlled.    Chronic thrombocytopenia  - Stable.    DVT prophylaxis: SCDs Code Status: Full Family Communication: None at bedside Disposition Plan: DC home when medically improved, possibly 9/10.   Consultants:   ID  Procedures:   None  Antimicrobials:   IV Acyclovir  Amoxicillin    Subjective: Feels much better. No pain reported. Denies visual symptoms. Swelling around the eyes improving.  Objective:  Vitals:   07/29/16 1459 07/29/16 2257 07/30/16 0644 07/30/16 1326  BP: 138/67 115/64 119/62 128/76  Pulse: (!) 51 (!) 51 (!) 55 (!) 48  Resp: 18 19 20 18   Temp: 98.7 F (37.1 C) 98.5 F (36.9 C) 97.8 F (36.6 C) 98.3 F (36.8 C)  TempSrc: Oral Oral Axillary Oral  SpO2: 98% 100% 98% 96%  Weight:      Height:        Intake/Output Summary (Last 24 hours) at 07/30/16 1503 Last data filed at 07/30/16 0500  Gross per 24 hour  Intake              340 ml  Output              400 ml  Net              -60 ml   Filed Weights   07/28/16 2105  Weight: 57.1 kg (125 lb 12.8 oz)    Examination:  General exam: Pleasant young female seen ambulating comfortably in the room.  Looks much improved compared to yesterday. HEENT: Crusting vesicular rash extending from mid lying forehead to left side temporal region. Unable to clearly determine rash on scalp due to hair. No lesions seen in the year.  Eyes: Except mild  periorbital edema which is improving , no acute findings. Pupils equally reacting to light and accommodation. No photophobia.  Respiratory system: Clear to auscultation. Respiratory effort normal. Cardiovascular system: S1 & S2 heard, RRR. No JVD, murmurs, rubs, gallops or clicks. No pedal edema. Gastrointestinal system: Abdomen is nondistended, soft and nontender. No organomegaly or masses felt. Normal bowel sounds heard. Central nervous system: Alert and oriented. No focal neurological deficits. Extremities: Symmetric 5 x 5 power. Skin: No  rashes, lesions or ulcers Psychiatry: Judgement and insight appear normal. Mood & affect appropriate.     Data Reviewed: I have personally reviewed following labs and imaging studies  CBC:  Recent Labs Lab 07/28/16 1558 07/29/16 0549  WBC 4.8 5.2  NEUTROABS 2.3  --   HGB 13.8 12.1  HCT 41.2 37.1  MCV 89.6 90.5  PLT 106* [id number]*   Basic Metabolic Panel:  Recent Labs Lab 07/28/16 1558 07/29/16 0549  NA 139 139  K 4.2 4.4  CL 107 106  CO2 25 25  GLUCOSE 108* 110*  BUN 5* 6  CREATININE 0.79 0.85  CALCIUM 9.3 8.9   GFR: Estimated Creatinine Clearance: 72.8 mL/min (by C-G formula based on SCr of 0.85 mg/dL). Liver Function Tests:  Recent Labs Lab 07/28/16 1558 07/29/16 0549  AST 16 12*  ALT 11* 11*  ALKPHOS 59 53  BILITOT 0.6 0.7  PROT 7.0 6.0*  ALBUMIN 4.0 3.5   No results for input(s): LIPASE, AMYLASE in the last 168 hours. No results for input(s): AMMONIA in the last 168 hours. Coagulation Profile: No results for input(s): INR, PROTIME in the last 168 hours. Cardiac Enzymes: No results for input(s): CKTOTAL, CKMB, CKMBINDEX, TROPONINI in the last 168 hours. BNP (last 3 results) No results for input(s): PROBNP in the last 8760 hours. HbA1C: No results for input(s): HGBA1C in the last 72 hours. CBG: No results for input(s): GLUCAP in the last 168 hours. Lipid Profile: No results for input(s): CHOL, HDL, LDLCALC, TRIG, CHOLHDL, LDLDIRECT in the last 72 hours. Thyroid Function Tests:  Recent Labs  07/29/16 2334  TSH 2.643   Anemia Panel: No results for input(s): VITAMINB12, FOLATE, FERRITIN, TIBC, IRON, RETICCTPCT in the last 72 hours.  Sepsis Labs:  Recent Labs Lab 07/28/16 1607  LATICACIDVEN 0.70    No results found for this or any previous visit (from the past 240 hour(s)).       Radiology Studies: No results found.      Scheduled Meds: . acyclovir  10 mg/kg Intravenous Q8H  . amoxicillin  500 mg Oral TID  . buPROPion  150 mg  Oral Daily  . erythromycin   Left Eye BID  . pantoprazole  40 mg Oral Daily  . predniSONE  60 mg Oral Q breakfast   Continuous Infusions:     LOS: 2 days    Time spent: 20 minutes.    [facility], MD [facility] Pager [phone]  If 7PM-7AM, please contact night-coverage [email or web address] Password [email or web address] 07/30/2016, 3:03 PM

## 2016-07-30 NOTE — Progress Notes (Signed)
Patient's med rec has been updated correctly to reflect what patient has been taking at home. This has been verified with the 2 pharmacies she uses. She is not on levothyroxine. This has been discussed with Dr Algis Liming and orders have been updated to reflect this  Levester Fresh, PharmD, BCPS, [facility] Clinical Pharmacist Pager [phone] 07/30/2016 4:35 PM

## 2016-07-31 MED ORDER — PREDNISONE 10 MG PO TABS: ORAL_TABLET | ORAL | 0 refills | Status: DC

## 2016-07-31 NOTE — Progress Notes (Signed)
Alyssa Shaffer to be D/C'd  per MD order. Discussed with the patient and all questions fully answered.  VSS, Skin clean, dry and intact without evidence of skin break down, no evidence of skin tears noted.  IV catheter discontinued intact. Site without signs and symptoms of complications. Dressing and pressure applied.  An After Visit Summary was printed and given to the patient. Patient received prescription.  D/c education completed with patient/family including follow up instructions, medication list, d/c activities limitations if indicated, with other d/c instructions as indicated by MD - patient able to verbalize understanding, all questions fully answered.   Patient instructed to return to ED, call 911, or call MD for any changes in condition.   Patient to be escorted via No Name, and D/C home via private auto.

## 2016-07-31 NOTE — Discharge Instructions (Addendum)
Shingles Shingles, which is also known as herpes zoster, is an infection that causes a painful skin rash and fluid-filled blisters. Shingles is not related to genital herpes, which is a sexually transmitted infection.   Shingles only develops in people who:  Have had chickenpox.  Have received the chickenpox vaccine. (This is rare.) CAUSES Shingles is caused by varicella-zoster virus (VZV). This is the same virus that causes chickenpox. After exposure to VZV, the virus stays in the body in an inactive (dormant) state. Shingles develops if the virus reactivates. This can happen many years after the initial exposure to VZV. It is not known what causes this virus to reactivate. RISK FACTORS People who have had chickenpox or received the chickenpox vaccine are at risk for shingles. Infection is more common in people who:  Are older than age 38.  Have a weakened defense (immune) system, such as those with HIV, AIDS, or cancer.  Are taking medicines that weaken the immune system, such as transplant medicines.  Are under great stress. SYMPTOMS Early symptoms of this condition include itching, tingling, and pain in an area on your skin. Pain may be described as burning, stabbing, or throbbing. A few days or weeks after symptoms start, a painful red rash appears, usually on one side of the body in a bandlike or beltlike pattern. The rash eventually turns into fluid-filled blisters that break open, scab over, and dry up in about 2-3 weeks. At any time during the infection, you may also develop:  A fever.  Chills.  A headache.  An upset stomach. DIAGNOSIS This condition is diagnosed with a skin exam. Sometimes, skin or fluid samples are taken from the blisters before a diagnosis is made. These samples are examined under a microscope or sent to a lab for testing. TREATMENT There is no specific cure for this condition. Your health care provider will probably prescribe medicines to help you  manage pain, recover more quickly, and avoid long-term problems. Medicines may include:  Antiviral drugs.  Anti-inflammatory drugs.  Pain medicines. If the area involved is on your face, you may be referred to a specialist, such as an eye doctor (ophthalmologist) or an ear, nose, and throat (ENT) doctor to help you avoid eye problems, chronic pain, or disability. HOME CARE INSTRUCTIONS Medicines  Take medicines only as directed by your health care provider.  Apply an anti-itch or numbing cream to the affected area as directed by your health care provider. Blister and Rash Care  Take a cool bath or apply cool compresses to the area of the rash or blisters as directed by your health care provider. This may help with pain and itching.  Keep your rash covered with a loose bandage (dressing). Wear loose-fitting clothing to help ease the pain of material rubbing against the rash.  Keep your rash and blisters clean with mild soap and cool water or as directed by your health care provider.  Check your rash every day for signs of infection. These include redness, swelling, and pain that lasts or increases.  Do not pick your blisters.  Do not scratch your rash. General Instructions  Rest as directed by your health care provider.  Keep all follow-up visits as directed by your health care provider. This is important.  Until your blisters scab over, your infection can cause chickenpox in people who have never had it or been vaccinated against it. To prevent this from happening, avoid contact with other people, especially:  Babies.  Pregnant women.  Children  who have eczema.  Elderly people who have transplants.  People who have chronic illnesses, such as leukemia or AIDS. SEEK MEDICAL CARE IF:  Your pain is not relieved with prescribed medicines.  Your pain does not get better after the rash heals.  Your rash looks infected. Signs of infection include redness, swelling, and pain  that lasts or increases. SEEK IMMEDIATE MEDICAL CARE IF:  The rash is on your face or nose.  You have facial pain, pain around your eye area, or loss of feeling on one side of your face.  You have ear pain or you have ringing in your ear.  You have loss of taste.  Your condition gets worse.   This information is not intended to replace advice given to you by your health care provider. Make sure you discuss any questions you have with your health care provider.   Document Released: 11/07/2005 Document Revised: 11/28/2014 Document Reviewed: 09/18/2014 Elsevier Interactive Patient Education 2016 [employer]   Pain Medicine Instructions HOW CAN PAIN MEDICINE AFFECT ME?  You were given a prescription for pain medicine. This medicine may make you tired or drowsy and may affect your ability to think clearly. Pain medicine may also affect your ability to drive or perform certain physical activities. It may not be possible to make all of your pain go away, but you should be comfortable enough to move, breathe, and take care of yourself. HOW OFTEN SHOULD I TAKE PAIN MEDICINE AND HOW MUCH SHOULD I TAKE?  Take pain medicine only as directed by your health care provider and only as needed for pain.  You do not need to take pain medicine if you are not having pain, unless directed by your health care provider.  You can take less than the prescribed dose if you find that a smaller amount of medicine controls your pain. WHAT RESTRICTIONS DO I HAVE WHILE TAKING PAIN MEDICINE? Follow these instructions after you start taking pain medicine, while you are taking the medicine, and for 8 hours after you stop taking the medicine:  Do not drive.  Do not operate machinery.  Do not operate power tools.  Do not sign legal documents.  Do not drink alcohol.  Do not take sleeping pills.  Do not supervise children by yourself.  Do not participate in activities that require climbing or being in  high places.  Do not enter a body of water--such as a lake, river, ocean, spa, or swimming pool--without an adult nearby who can monitor and help you. HOW CAN I KEEP OTHERS SAFE WHILE I AM TAKING PAIN MEDICINE?  Store your pain medicine as directed by your health care provider. Make sure that it is placed where children and pets cannot reach it.  Never share your pain medicine with anyone.  Do not save any leftover pills. If you have any leftover pain medicine, get rid of it or destroy it as directed by your health care provider. WHAT ELSE DO I NEED TO KNOW ABOUT TAKING PAIN MEDICINE?  Use a stool softener if you become constipated from your pain medicine. Increasing your intake of fruits and vegetables will also help with constipation.  Write down the times when you take your pain medicine. Look at the times before you take your next dose of medicine. It is easy to become confused while on pain medicine. Recording the times helps you to avoid an overdose.  If your pain is severe, do not try to treat it yourself by taking more  pills than instructed on your prescription. Contact your health care provider for help.  You may have been prescribed a pain medicine that contains acetaminophen. Do not take any other acetaminophen while taking this medicine. An overdose of acetaminophen can result in severe liver damage. Acetaminophen is found in many over-the-counter (OTC) and prescription medicines. If you are taking any medicines in addition to your pain medicine, check the active ingredients on those medicines to see if acetaminophen is listed. WHEN SHOULD I CALL MY HEALTH CARE PROVIDER?  Your medicine is not helping to make the pain go away.  You vomit or have diarrhea shortly after taking the medicine.  You develop new pain in areas that did not hurt before.  You have an allergic reaction to your medicine. This may include: ? Itchiness. ? Swelling. ? Dizziness. ? Developing a new  rash. WHEN SHOULD I CALL 911 OR GO TO THE EMERGENCY ROOM?  You feel dizzy or you faint.  You are very confused or disoriented.  You repeatedly vomit.  Your skin or lips turn pale or bluish in color.  You have shortness of breath or you are breathing much more slowly than usual.  You have a severe allergic reaction to your medicine. This includes: ? Developing tongue swelling. ? Having difficulty breathing.  This information is not intended to replace advice given to you by your health care provider. Make sure you discuss any questions you have with your health care provider.  Document Released: 02/13/2001 Document Revised: 03/24/2015 Document Reviewed: 09/11/2014 Elsevier Interactive Patient Education [employer].

## 2016-07-31 NOTE — Discharge Summary (Signed)
Physician Discharge Summary  Alyssa Shaffer [id number] DOB: 10/14/1976  PCP: Alvester Chou, NP  Admit date: 07/28/2016 Discharge date: 07/31/2016  Admitted From: Home Disposition:  Home  Recommendations for Outpatient Follow-up:  1. Alvester Chou, NP/PCP: Patient states that she Is a prior appointment on 08/01/16 and is advised to keep it. Also recommend repeating labs (CBC & CMP) in 1 week. 2. Dr. Jalene Mullet, Opthalmology: patient has prior appointment on 08/03/2016.  Home Health: None Equipment/Devices: None   Discharge Condition:  Improved and stable. CODE STATUS: Full  Diet recommendation: Heart healthy diet.  Discharge Diagnoses:  Principal Problem:   Herpes zoster Active Problems:   Intractable pain   HTN (hypertension)   Brief/Interim Summary: 40 year old female with history of cervical cancer and HTN presented to the ED on 9/5 for evaluation of left side of forehead/scalp pain and a vesicular rash that had been present for a week prior. She was diagnosed with herpes zoster ophthalmicus, prescribed oral Valtrex, amoxicillin, prednisone taper, hydrocodone for pain. She followed up with the ophthalmologist on 9/6 and as per report, there were no significant findings on exam to suggest complication from zoster but erythromycin ointment was recommended in addition to other therapies prescribed. She'll return to ED on 9/7 complaining of intractable facial pain, blurred vision but no vision loss or ear pain. She noted progressive swelling and tightness of left side of her face followed by progression towards the right. Admitted for management of shingles. ID consulted 9/8.   Assessment & Plan:   Herpes zoster - Infectious disease consultation 9/8 appreciated. Recommend continuing IV acyclovir, pain management and amoxicillin and recommend ophthalmology follow-up. She does not appear to have cellulitis at this time and her swelling is more consistent with edema and her  blisters are crusting which is encouraging. - Pain management. Placed on airborne and contact precautions. - Clinically much improved.  - Discussed with infectious disease M.D. on call who recommended: Complete total 10 days antivirals including 3 days of IV acyclovir she received in the hospital and has Valtrex from prior to admission, prednisone taper over 14 days, a weeks course of amoxicillin.  - Patient and spouse at bedside were counseled regarding precautions with 40 year old at home. Patient's mother apparently also has shingles. - She has a follow-up appointment with her PCP tomorrow and with the ophthalmologist on 9/13. She was advised to seek immediate medical attention in the case of worsening of symptoms and she will blest understanding. - HIV antibodies: Nonreactive.  Hyperthyroidism - TSH normal. - There was some confusion regarding her medications. Initially it was not clear whether she was taking Synthroid or methimazole. Pharmacy was eventually able to verify on 9/19 that she was actually taking methimazole. Clinically euthyroid. Continue same.   Essential hypertension - Controlled.    Chronic thrombocytopenia  - Stable.    Consultants:   ID  Procedures:   None   Discharge Instructions  Discharge Instructions    Activity as tolerated - No restrictions    Complete by:  As directed   Call MD for:    Complete by:  As directed   Any problems with your vision.   Call MD for:  redness, tenderness, or signs of infection (pain, swelling, redness, odor or green/yellow discharge around incision site)    Complete by:  As directed   Call MD for:  severe uncontrolled pain    Complete by:  As directed   Call MD for:  temperature >100.4    Complete by:  As directed   Diet - low sodium heart healthy    Complete by:  As directed       Medication List    STOP taking these medications   HYDROcodone-acetaminophen 5-325 MG tablet Commonly known as:  NORCO/VICODIN    ibuprofen 800 MG tablet Commonly known as:  ADVIL,MOTRIN   predniSONE 10 MG (21) Tbpk tablet Commonly known as:  STERAPRED UNI-PAK 21 TAB Replaced by:  predniSONE 10 MG tablet     TAKE these medications   amoxicillin 500 MG capsule Commonly known as:  AMOXIL Take 1 capsule (500 mg total) by mouth 3 (three) times daily. What changed:  additional instructions   erythromycin ophthalmic ointment Place 1 application into the left eye 2 (two) times daily.   methimazole 5 MG tablet Commonly known as:  TAPAZOLE Take 5 mg by mouth 2 (two) times daily.   omeprazole 20 MG capsule Commonly known as:  PRILOSEC Take 20 mg by mouth daily.   oxyCODONE-acetaminophen 10-325 MG tablet Commonly known as:  PERCOCET Take 1 tablet by mouth every 6 (six) hours. for pain   predniSONE 10 MG tablet Commonly known as:  DELTASONE Take 5 tabs daily 3 days, then 4 tabs daily 3 days, then 3 tabs daily 3 days, then 2 tabs daily 3 days, then 1 tablet daily 3 days, then stop. Start taking on:  08/01/2016 Replaces:  predniSONE 10 MG (21) Tbpk tablet   valACYclovir 1000 MG tablet Commonly known as:  VALTREX Take 1 tablet (1,000 mg total) by mouth 3 (three) times daily. What changed:  additional instructions      Follow-up Information    Alvester Chou, NP Follow up on 08/01/2016.   Specialty:  Nurse Practitioner Why:  Please keep the prior appointment. Recommend repeat labs (CBC & CMP) in 1 week. Contact information: [facility] [place] [address] [phone]        Royston Cowper, MD Follow up on 08/03/2016.   Specialty:  Ophthalmology Why:  Please keep prior appointment. Contact information: [address] [place] [address] [phone]          Allergies  Allergen Reactions  . Bee Venom Anaphylaxis    Throat closure   . Hydrocodone Itching  . Meloxicam Hives and Nausea And Vomiting     Procedures/Studies: No  results found.    Subjective: States that she is doing much better. All lesions are starting to crust. No significant pain reported. No visual or ear symptoms. Anxious to go home. In fact was threatening to leave AMA this morning.   Discharge Exam:  Vitals:   07/30/16 0644 07/30/16 1326 07/30/16 2213 07/31/16 0619  BP: 119/62 128/76 123/62 124/72  Pulse: (!) 55 (!) 48 (!) 50 (!) 48  Resp: 20 18 18 18   Temp: 97.8 F (36.6 C) 98.3 F (36.8 C) 98 F (36.7 C) 98.2 F (36.8 C)  TempSrc: Axillary Oral Oral Oral  SpO2: 98% 96% 99% 97%  Weight:      Height:        General exam: Pleasant young female seen ambulating comfortably in the room.  Looks much improved since admission. HEENT: Crusting vesicular rash extending from midline forehead to left side temporal region. Unable to clearly determine rash on scalp due to hair. No lesions seen in the ear. All lesions seem to be crusting and no new lesions seen Eyes: periorbital edema which has nearly resolved , no acute findings.  Pupils equally reacting to light and accommodation. No photophobia.  Respiratory system: Clear to auscultation. Respiratory effort normal. Cardiovascular system: S1 & S2 heard, RRR. No JVD, murmurs, rubs, gallops or clicks. No pedal edema. Gastrointestinal system: Abdomen is nondistended, soft and nontender. No organomegaly or masses felt. Normal bowel sounds heard. Central nervous system: Alert and oriented. No focal neurological deficits. Extremities: Symmetric 5 x 5 power. Skin: No rashes, lesions or ulcers Psychiatry: Judgement and insight appear normal. Mood & affect appropriate.      The results of significant diagnostics from this hospitalization (including imaging, microbiology, ancillary and laboratory) are listed below for reference.     Microbiology: No results found for this or any previous visit (from the past 240 hour(s)).   Labs: BNP (last 3 results) No results for input(s): BNP in the last  8760 hours. Basic Metabolic Panel:  Recent Labs Lab 07/28/16 1558 07/29/16 0549  NA 139 139  K 4.2 4.4  CL 107 106  CO2 25 25  GLUCOSE 108* 110*  BUN 5* 6  CREATININE 0.79 0.85  CALCIUM 9.3 8.9   Liver Function Tests:  Recent Labs Lab 07/28/16 1558 07/29/16 0549  AST 16 12*  ALT 11* 11*  ALKPHOS 59 53  BILITOT 0.6 0.7  PROT 7.0 6.0*  ALBUMIN 4.0 3.5   No results for input(s): LIPASE, AMYLASE in the last 168 hours. No results for input(s): AMMONIA in the last 168 hours. CBC:  Recent Labs Lab 07/28/16 1558 07/29/16 0549  WBC 4.8 5.2  NEUTROABS 2.3  --   HGB 13.8 12.1  HCT 41.2 37.1  MCV 89.6 90.5  PLT 106* 103*   Thyroid function studies  Recent Labs  07/29/16 2334  TSH 2.643      Time coordinating discharge: Over 30 minutes  SIGNED:  Vernell Leep, MD, FACP, [place]. [facility] Pager [phone]  If 7PM-7AM, please contact night-coverage [email or web address] Password [facility] 07/31/2016, 5:43 PM

## 2017-05-08 ENCOUNTER — Encounter (HOSPITAL_COMMUNITY): Payer: Self-pay

## 2017-05-08 ENCOUNTER — Emergency Department (HOSPITAL_COMMUNITY)
Admission: EM | Admit: 2017-05-08 | Discharge: 2017-05-08 | Disposition: A | Discharge status: Home / Self Care | Payer: Medicaid Other | Attending: Emergency Medicine | Admitting: Emergency Medicine

## 2017-05-08 ENCOUNTER — Emergency Department (HOSPITAL_COMMUNITY): Payer: Medicaid Other

## 2017-05-08 DIAGNOSIS — M79602 Pain in left arm: Secondary | ICD-10-CM | POA: Diagnosis present

## 2017-05-08 DIAGNOSIS — R4781 Slurred speech: Secondary | ICD-10-CM | POA: Diagnosis not present

## 2017-05-08 DIAGNOSIS — Z79899 Other long term (current) drug therapy: Secondary | ICD-10-CM | POA: Insufficient documentation

## 2017-05-08 DIAGNOSIS — I10 Essential (primary) hypertension: Secondary | ICD-10-CM | POA: Diagnosis not present

## 2017-05-08 DIAGNOSIS — F17200 Nicotine dependence, unspecified, uncomplicated: Secondary | ICD-10-CM | POA: Insufficient documentation

## 2017-05-08 LAB — I-STAT TROPONIN, ED: TROPONIN I, POC: 0.01 ng/mL (ref 0.00–0.08)

## 2017-05-08 LAB — PROTIME-INR
INR: 1
Prothrombin Time: 13.2 seconds (ref 11.4–15.2)

## 2017-05-08 LAB — DIFFERENTIAL
BASOS ABS: 0 10*3/uL (ref 0.0–0.1)
BASOS PCT: 0 %
EOS ABS: 0.1 10*3/uL (ref 0.0–0.7)
Eosinophils Relative: 1 %
LYMPHS ABS: 2.6 10*3/uL (ref 0.7–4.0)
Lymphocytes Relative: 34 %
MONOS PCT: 7 %
Monocytes Absolute: 0.5 10*3/uL (ref 0.1–1.0)
NEUTROS PCT: 57 %
Neutro Abs: 4.3 10*3/uL (ref 1.7–7.7)

## 2017-05-08 LAB — I-STAT CHEM 8, ED
BUN: 31 mg/dL — ABNORMAL HIGH (ref 6–20)
Calcium, Ion: 1.1 mmol/L — ABNORMAL LOW (ref 1.15–1.40)
Chloride: 105 mmol/L (ref 101–111)
Creatinine, Ser: 1.3 mg/dL — ABNORMAL HIGH (ref 0.44–1.00)
Glucose, Bld: 97 mg/dL (ref 65–99)
HEMATOCRIT: 41 % (ref 36.0–46.0)
HEMOGLOBIN: 13.9 g/dL (ref 12.0–15.0)
POTASSIUM: 3.7 mmol/L (ref 3.5–5.1)
SODIUM: 138 mmol/L (ref 135–145)
TCO2: 22 mmol/L (ref 0–100)

## 2017-05-08 LAB — CBC
HEMATOCRIT: 40.8 % (ref 36.0–46.0)
Hemoglobin: 13.8 g/dL (ref 12.0–15.0)
MCH: 30.8 pg (ref 26.0–34.0)
MCHC: 33.8 g/dL (ref 30.0–36.0)
MCV: 91.1 fL (ref 78.0–100.0)
Platelets: 109 10*3/uL — ABNORMAL LOW (ref 150–400)
RBC: 4.48 MIL/uL (ref 3.87–5.11)
RDW: 13.9 % (ref 11.5–15.5)
WBC: 7.5 10*3/uL (ref 4.0–10.5)

## 2017-05-08 LAB — RAPID URINE DRUG SCREEN, HOSP PERFORMED
Amphetamines: NOT DETECTED
BARBITURATES: NOT DETECTED
Benzodiazepines: NOT DETECTED
COCAINE: POSITIVE — AB
Opiates: NOT DETECTED
Tetrahydrocannabinol: NOT DETECTED

## 2017-05-08 LAB — COMPREHENSIVE METABOLIC PANEL
ALT: 13 U/L — ABNORMAL LOW (ref 14–54)
ANION GAP: 8 (ref 5–15)
AST: 20 U/L (ref 15–41)
Albumin: 4 g/dL (ref 3.5–5.0)
Alkaline Phosphatase: 44 U/L (ref 38–126)
BILIRUBIN TOTAL: 0.8 mg/dL (ref 0.3–1.2)
BUN: 29 mg/dL — AB (ref 6–20)
CO2: 22 mmol/L (ref 22–32)
Calcium: 8.7 mg/dL — ABNORMAL LOW (ref 8.9–10.3)
Chloride: 105 mmol/L (ref 101–111)
Creatinine, Ser: 1.3 mg/dL — ABNORMAL HIGH (ref 0.44–1.00)
GFR calc Af Amer: 59 mL/min — ABNORMAL LOW (ref >60)
GFR, EST NON AFRICAN AMERICAN: 51 mL/min — AB (ref >60)
Glucose, Bld: 101 mg/dL — ABNORMAL HIGH (ref 65–99)
POTASSIUM: 3.7 mmol/L (ref 3.5–5.1)
Sodium: 135 mmol/L (ref 135–145)
TOTAL PROTEIN: 6.6 g/dL (ref 6.5–8.1)

## 2017-05-08 LAB — ETHANOL

## 2017-05-08 LAB — D-DIMER, QUANTITATIVE: D-Dimer, Quant: 0.61 ug/mL-FEU — ABNORMAL HIGH (ref 0.00–0.50)

## 2017-05-08 LAB — APTT: APTT: 29 s (ref 24–36)

## 2017-05-08 LAB — TROPONIN I: Troponin I: <0.03 ng/mL (ref <0.03)

## 2017-05-08 MED ORDER — IOPAMIDOL (ISOVUE-370) INJECTION 76%
INTRAVENOUS | Status: AC
Start: 2017-05-08 — End: 2017-05-08
  Administered 2017-05-08: 100 mL
  Filled 2017-05-08: qty 100

## 2017-05-08 MED ORDER — ACETAMINOPHEN 325 MG PO TABS
650.0000 mg | ORAL_TABLET | Freq: Once | ORAL | Status: AC
  Administered 2017-05-08: 650 mg via ORAL
  Filled 2017-05-08: qty 2

## 2017-05-08 NOTE — ED Notes (Signed)
Patient transported to CT 

## 2017-05-08 NOTE — ED Provider Notes (Signed)
[facility] DEPT Provider Note   CSN: [id number] Arrival date & time: 05/08/17  0044  By signing my name below, I, Margit Banda, attest that this documentation has been prepared under the direction and in the presence of Aiya Keach, Annie Main, MD. Electronically Signed: Margit Banda, ED Scribe. 05/08/17. 1:21 AM.  History   Chief Complaint Chief Complaint  Patient presents with  . Code Stroke    HPI Alyssa Shaffer is a 41 y.o. female with a PMHx HTN, who presents to the Emergency Department complaining of sudden, gradually worsening, left arm pain that started ~ 11:30 pm (05/07/17). Pt reports she has just gotten out of the shower when pain started. Associated sx include numbness and weakness to her left arm, CP, diaphoresis, and SOB. No drug use today. Smokes cigarettes. No medication for BP. Per pt's family she has a hx of drug abuse. Never had pain like this before. Pt denies head pain, neck pain, back pain, facial droop, and nausea.Denies trauma.  PCP: Alvester Chou, NP  The history is provided by the patient and a relative. No language interpreter was used.    Past Medical History:  Diagnosis Date  . Cancer ([place])   . Cyst of fallopian tube 08/02/2013  . Hypertension     Patient Active Problem List   Diagnosis Date Noted  . 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 Intractable pain 07/28/2016  . HTN (hypertension) 07/28/2016  . Herpes zoster 07/28/2016  . Chronic pelvic pain in female 09/28/2013    Past Surgical History:  Procedure Laterality Date  . CERVIX REMOVAL     partial  . TONSILLECTOMY    . TUBAL LIGATION      OB History    No data available       Home Medications    Prior to Admission medications   Medication Sig Start Date End Date Taking? Authorizing Provider  amoxicillin (AMOXIL) 500 MG capsule Take 1 capsule (500 mg total) by mouth 3 (three)  times daily. Patient taking differently: Take 500 mg by mouth 3 (three) times daily. 7 day course filled 07/26/16 07/26/16   Sam, Olivia Canter, PA-C  erythromycin ophthalmic ointment Place 1 application into the left eye 2 (two) times daily.    [provider]  methimazole (TAPAZOLE) 5 MG tablet Take 5 mg by mouth 2 (two) times daily.     [provider]  omeprazole (PRILOSEC) 20 MG capsule Take 20 mg by mouth daily. 08/06/15   [provider]  oxyCODONE-acetaminophen (PERCOCET) 10-325 MG tablet Take 1 tablet by mouth every 6 (six) hours. for pain 09/09/15   [provider]  predniSONE (DELTASONE) 10 MG tablet Take 5 tabs daily 3 days, then 4 tabs daily 3 days, then 3 tabs daily 3 days, then 2 tabs daily 3 days, then 1 tablet daily 3 days, then stop. 08/01/16   Hongalgi, Lenis Dickinson, MD    Family History Family History  Problem Relation Age of Onset  . Heart disease Mother   . Seizures Mother   . Hypertension Father   . Heart disease Father     Social History Social History  Substance Use Topics  . Smoking status: Current Every Day Smoker    Packs/day: 1.00    Years: 27.00  . Smokeless tobacco: Never Used  . Alcohol use Yes     Comment: socailly     Allergies   Bee venom; Hydrocodone; and  Meloxicam   Review of Systems Review of Systems  A complete 10 system review of systems was obtained and all systems are negative except as noted in the HPI and PMH.   Physical Exam Updated Vital Signs BP 114/72 (BP Location: Right Arm)   Pulse 64   Temp 98.5 F (36.9 C) (Oral)   Resp 16   Ht 5\' 4"  (1.626 m)   Wt 134 lb 12.8 oz (61.1 kg)   SpO2 97%   BMI 23.14 kg/m   Physical Exam  Constitutional: She is oriented to person, place, and time. She appears well-developed and well-nourished. No distress.  No facial droop. Slow to respond.   HENT:  Head: Normocephalic and atraumatic.  Mouth/Throat: Oropharynx is clear and moist. No oropharyngeal exudate.    Poor dentition. Tongue is midline.    Eyes: Conjunctivae and EOM are normal. Pupils are equal, round, and reactive to light.  Neck: Normal range of motion. Neck supple.  No meningismus.  Cardiovascular: Normal rate, regular rhythm, normal heart sounds and intact distal pulses.   No murmur heard. Pulmonary/Chest: Effort normal and breath sounds normal. No respiratory distress.  Abdominal: Soft. There is no tenderness. There is no rebound and no guarding.  Musculoskeletal: Normal range of motion. She exhibits no edema or tenderness.  Pain with palpation and ROM of left upper arm. No neck or shoulder pain. Intact radial pulses.  Neurological: She is alert and oriented to person, place, and time. No cranial nerve deficit. She exhibits normal muscle tone. Coordination normal.  Decreased grip strength on the left. Able to hold arms and legs up against gravity. No pronator drift. No facial droop. No ataxia on finger to nose.  Skin: Skin is warm.  Psychiatric: She has a normal mood and affect. Her behavior is normal.  Nursing note and vitals reviewed.    ED Treatments / Results  DIAGNOSTIC STUDIES: Oxygen Saturation is 97% on RA, normal by my interpretation.   COORDINATION OF CARE: 1:21 AM-Discussed next steps with pt. Pt verbalized understanding and is agreeable with the plan.   Labs (all labs ordered are listed, but only abnormal results are displayed) Labs Reviewed  CBC - Abnormal; Notable for the following:       Result Value   Platelets 109 (*)    All other components within normal limits  COMPREHENSIVE METABOLIC PANEL - Abnormal; Notable for the following:    Glucose, Bld 101 (*)    BUN 29 (*)    Creatinine, Ser 1.30 (*)    Calcium 8.7 (*)    ALT 13 (*)    GFR calc non Af Amer 51 (*)    GFR calc Af Amer 59 (*)    All other components within normal limits  D-DIMER, QUANTITATIVE (NOT AT [facility]) - Abnormal; Notable for the following:    D-Dimer, Quant 0.61 (*)    All other  components within normal limits  RAPID URINE DRUG SCREEN, HOSP PERFORMED - Abnormal; Notable for the following:    Cocaine POSITIVE (*)    All other components within normal limits  I-STAT CHEM 8, ED - Abnormal; Notable for the following:    BUN 31 (*)    Creatinine, Ser 1.30 (*)    Calcium, Ion 1.10 (*)    All other components within normal limits  PROTIME-INR  APTT  DIFFERENTIAL  ETHANOL  TROPONIN I  I-STAT TROPOININ, ED  CBG MONITORING, ED    EKG  EKG Interpretation  Date/Time:  Monday May 08 2017 00:53:58 EDT Ventricular Rate:  62 PR Interval:  154 QRS Duration: 74 QT Interval:  488 QTC Calculation: 495 R Axis:   79 Text Interpretation:  Normal sinus rhythm Normal ECG No significant change was found Confirmed by Ezequiel Essex [phone]) on 05/08/2017 1:22:58 AM       Radiology Ct Head Code Stroke W/o Cm  Result Date: 05/08/2017 CLINICAL DATA:  Code stroke.  Slurred speech and left-sided numbness EXAM: CT HEAD WITHOUT CONTRAST TECHNIQUE: Contiguous axial images were obtained from the base of the skull through the vertex without intravenous contrast. COMPARISON:  Head CT 10/23/2015 FINDINGS: Brain: No mass lesion, intraparenchymal hemorrhage or extra-axial collection. No evidence of acute cortical infarct. Brain parenchyma and CSF-containing spaces are normal for age. Vascular: No hyperdense vessel or unexpected calcification. Skull: Normal visualized skull base, calvarium and extracranial soft tissues. Sinuses/Orbits: No sinus fluid levels or advanced mucosal thickening. No mastoid effusion. Normal orbits. ASPECTS [facility] Stroke Program Early CT Score) - Ganglionic level infarction (caudate, lentiform nuclei, internal capsule, insula, M1-M3 cortex): 7 - Supraganglionic infarction (M4-M6 cortex): 3 Total score (0-10 with 10 being normal): 10 IMPRESSION: 1. Normal head CT. 2. ASPECTS is 10. These results were called by telephone at the time of interpretation on 05/08/2017 at 1:17  am to Dr. Wallie Char, who verbally acknowledged these results. Electronically Signed   By: Ulyses Jarred M.D.   On: 05/08/2017 01:17    Procedures Procedures (including critical care time)  Medications Ordered in ED Medications - No data to display   Initial Impression / Assessment and Plan / ED Course  I have reviewed the triage vital signs and the nursing notes.  Pertinent labs & imaging results that were available during my care of the patient were reviewed by me and considered in my medical decision making (see chart for details).    Patient arrives with acute onset of left arm pain and numbness that onset about 11:30 PM. She was called a code stroke in triage. No facial droop, no leg weakness.  EKG with nonspecific T wave inversions, no comparison.  Patient seems to have muscular pain in L arm along biceps tendon.  Her weakness in the hand is likely secondary to pain. Does not involve neck or shoulder. Doubt radiculopathy.  1:18 AM BP is 114/72 on left arm.   3:24 AM Pt denies dizziness, SOB, shoulder pain, and neck pain. CP has subsided. Pt is okay with staying over night.  UDS is positive for cocaine. Patient continues to have reproducible left upper arm pain along biceps tendon. Her grip strength weakness has improved. Doubt CVA or TIA. Doubt ACS. Her EKG does have nonspecific T-wave inversions with no comparison. First troponin is negative. Patient last used cocaine 3 days ago.  Discussed with Dr. Nicole Kindred of neurology. He does not feel any further neurological workup as necessary.  CTA negative for aortic dissection or PE.  D/w Dr. Alcario Drought of hospitalist service.  He feels arm pain is musculoskeletal, reproducible and not cardiac. Will check second troponin and plan discharge if negative.   Second troponin negative. No evidence of MI or stroke. Follow up with PCP. Stop cocaine use. Return precautions discussed. Final Clinical Impressions(s) / ED Diagnoses   Final  diagnoses:  Pain of left upper extremity    New Prescriptions New Prescriptions   No medications on file  I personally performed the services described in this documentation, which was scribed in my presence. The recorded information has been reviewed and is  accurate.     Ezequiel Essex, MD 05/08/17 2321

## 2017-05-08 NOTE — ED Triage Notes (Signed)
Pt complaining of L arm numbness and pain. Per family pt also having some slurred speech. Family states LSN [id number] tonight.

## 2017-05-08 NOTE — Discharge Instructions (Signed)
There is no evidence of heart attack or stroke. Stop using cocaine. Follow-up with your doctor. Return to the ED if you develop they're worsening symptoms.

## 2018-01-07 ENCOUNTER — Emergency Department (HOSPITAL_COMMUNITY)
Admission: EM | Admit: 2018-01-07 | Discharge: 2018-01-07 | Disposition: A | Discharge status: Home / Self Care | Payer: Medicaid Other | Attending: Emergency Medicine | Admitting: Emergency Medicine

## 2018-01-07 ENCOUNTER — Emergency Department (HOSPITAL_COMMUNITY): Payer: Medicaid Other

## 2018-01-07 ENCOUNTER — Encounter (HOSPITAL_COMMUNITY): Payer: Self-pay

## 2018-01-07 DIAGNOSIS — Y9389 Activity, other specified: Secondary | ICD-10-CM | POA: Insufficient documentation

## 2018-01-07 DIAGNOSIS — Y999 Unspecified external cause status: Secondary | ICD-10-CM | POA: Insufficient documentation

## 2018-01-07 DIAGNOSIS — F17210 Nicotine dependence, cigarettes, uncomplicated: Secondary | ICD-10-CM | POA: Diagnosis not present

## 2018-01-07 DIAGNOSIS — I10 Essential (primary) hypertension: Secondary | ICD-10-CM | POA: Diagnosis not present

## 2018-01-07 DIAGNOSIS — T17908A Unspecified foreign body in respiratory tract, part unspecified causing other injury, initial encounter: Secondary | ICD-10-CM | POA: Diagnosis not present

## 2018-01-07 DIAGNOSIS — R0602 Shortness of breath: Secondary | ICD-10-CM | POA: Diagnosis present

## 2018-01-07 DIAGNOSIS — Z79899 Other long term (current) drug therapy: Secondary | ICD-10-CM | POA: Diagnosis not present

## 2018-01-07 DIAGNOSIS — Y929 Unspecified place or not applicable: Secondary | ICD-10-CM | POA: Diagnosis not present

## 2018-01-07 DIAGNOSIS — Z859 Personal history of malignant neoplasm, unspecified: Secondary | ICD-10-CM | POA: Diagnosis not present

## 2018-01-07 DIAGNOSIS — W228XXA Striking against or struck by other objects, initial encounter: Secondary | ICD-10-CM | POA: Diagnosis not present

## 2018-01-07 LAB — CBC WITH DIFFERENTIAL/PLATELET
BASOS PCT: 0 %
Basophils Absolute: 0 10*3/uL (ref 0.0–0.1)
EOS ABS: 0.2 10*3/uL (ref 0.0–0.7)
Eosinophils Relative: 3 %
HEMATOCRIT: 43.5 % (ref 36.0–46.0)
Hemoglobin: 14.4 g/dL (ref 12.0–15.0)
Lymphocytes Relative: 26 %
Lymphs Abs: 1.5 10*3/uL (ref 0.7–4.0)
MCH: 30.7 pg (ref 26.0–34.0)
MCHC: 33.1 g/dL (ref 30.0–36.0)
MCV: 92.8 fL (ref 78.0–100.0)
MONO ABS: 0.5 10*3/uL (ref 0.1–1.0)
MONOS PCT: 8 %
NEUTROS ABS: 3.7 10*3/uL (ref 1.7–7.7)
Neutrophils Relative %: 63 %
Platelets: 125 10*3/uL — ABNORMAL LOW (ref 150–400)
RBC: 4.69 MIL/uL (ref 3.87–5.11)
RDW: 14.1 % (ref 11.5–15.5)
WBC: 5.9 10*3/uL (ref 4.0–10.5)

## 2018-01-07 LAB — COMPREHENSIVE METABOLIC PANEL
ALK PHOS: 47 U/L (ref 38–126)
ALT: 7 U/L — AB (ref 14–54)
AST: 16 U/L (ref 15–41)
Albumin: 3.8 g/dL (ref 3.5–5.0)
Anion gap: 10 (ref 5–15)
BUN: 13 mg/dL (ref 6–20)
CALCIUM: 8.7 mg/dL — AB (ref 8.9–10.3)
CO2: 20 mmol/L — AB (ref 22–32)
CREATININE: 0.73 mg/dL (ref 0.44–1.00)
Chloride: 111 mmol/L (ref 101–111)
GFR calc Af Amer: >60 mL/min (ref >60)
GFR calc non Af Amer: >60 mL/min (ref >60)
GLUCOSE: 105 mg/dL — AB (ref 65–99)
Potassium: 4.3 mmol/L (ref 3.5–5.1)
SODIUM: 141 mmol/L (ref 135–145)
Total Bilirubin: 0.7 mg/dL (ref 0.3–1.2)
Total Protein: 6.5 g/dL (ref 6.5–8.1)

## 2018-01-07 LAB — I-STAT BETA HCG BLOOD, ED (MC, WL, AP ONLY): I-stat hCG, quantitative: <5 m[IU]/mL (ref <5)

## 2018-01-07 LAB — I-STAT CG4 LACTIC ACID, ED: Lactic Acid, Venous: 0.89 mmol/L (ref 0.5–1.9)

## 2018-01-07 MED ORDER — PREDNISONE 10 MG PO TABS: 40.0000 mg | ORAL_TABLET | Freq: Every day | ORAL | 0 refills | Status: DC

## 2018-01-07 MED ORDER — ALBUTEROL SULFATE HFA 108 (90 BASE) MCG/ACT IN AERS
2.0000 | INHALATION_SPRAY | RESPIRATORY_TRACT | Status: DC | PRN
  Administered 2018-01-07: 2 via RESPIRATORY_TRACT
  Filled 2018-01-07: qty 6.7

## 2018-01-07 MED ORDER — PREDNISONE 20 MG PO TABS
40.0000 mg | ORAL_TABLET | Freq: Once | ORAL | Status: AC
  Administered 2018-01-07: 40 mg via ORAL
  Filled 2018-01-07: qty 2

## 2018-01-07 NOTE — Discharge Instructions (Signed)
It appears as if you have coughed up the pill.  Watch for increasing trouble breathing.  Watch for fevers.  You may cough up more sputum and even a little blood iand this would be normal.

## 2018-01-07 NOTE — ED Triage Notes (Signed)
Patient complains of feeling like ibuprofen that she took this am went into her lungs for dental pain. Patient able to speak and sats 100%, fine until she took the medication. Anxious and hyperventilating on arrival

## 2018-01-07 NOTE — ED Notes (Signed)
ED Provider at bedside. 

## 2018-01-07 NOTE — ED Provider Notes (Signed)
[name] EMERGENCY DEPARTMENT Provider Note   CSN: [id number] Arrival date & time: 01/07/18  3419     History   Chief Complaint Chief Complaint  Patient presents with  . SOB/possible aspiration    HPI Alyssa Shaffer is a 42 y.o. female.  HPI Patient presents with shortness of breath and cough.  States she was taking ibuprofen for dental pain earlier today and she breathed in.  States went down her airway.  States she felt as if she was suffocating.  Continued some pain in her lower chest.  States since coming to the hospital she has however coughed up the pill.  She has had a cough for weeks before this however.  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  She continues to smoke but states that she is not to smoke anymore now.  Has some dull right-sided chest pain.  No fevers.  No swelling in her legs. Past Medical History:  Diagnosis Date  . Cancer ([place])   . Cyst of fallopian tube 08/02/2013  . Hypertension     Patient Active Problem List   Diagnosis Date Noted  . 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 Intractable pain 07/28/2016  . HTN (hypertension) 07/28/2016  . Herpes zoster 07/28/2016  . Chronic pelvic pain in female 09/28/2013    Past Surgical History:  Procedure Laterality Date  . CERVIX REMOVAL     partial  . TONSILLECTOMY    . TUBAL LIGATION      OB History    No data available       Home Medications    Prior to Admission medications   Medication Sig Start Date End Date Taking? Authorizing Provider  amoxicillin (AMOXIL) 500 MG capsule Take 1 capsule (500 mg total) by mouth 3 (three) times daily. Patient not taking: Reported on 05/08/2017 07/26/16   Sam, Olivia Canter, PA-C  predniSONE (DELTASONE) 10 MG tablet Take 4 tablets (40 mg total) by mouth daily. 01/08/18   Davonna Belling, MD  PRESCRIPTION MEDICATION Take 1 tablet by mouth  3 (three) times daily. For Shingles Virus    [provider]    Family History Family History  Problem Relation Age of Onset  . Heart disease Mother   . Seizures Mother   . Hypertension Father   . Heart disease Father     Social History Social History   Tobacco Use  . Smoking status: Current Every Day Smoker    Packs/day: 1.00    Years: 27.00    Pack years: 27.00  . Smokeless tobacco: Never Used  Substance Use Topics  . Alcohol use: Yes    Comment: socailly  . Drug use: No     Allergies   Bee venom and Meloxicam   Review of Systems Review of Systems  Constitutional: Negative for appetite change and fever.  HENT: Negative for congestion.   Respiratory: Positive for cough, shortness of breath and wheezing.   Cardiovascular: Positive for chest pain.  Gastrointestinal: Negative for abdominal pain.  Genitourinary: Negative for flank pain.  Musculoskeletal: Negative for back pain.  Neurological: Negative for weakness.  Hematological: Negative for adenopathy.  Psychiatric/Behavioral: Negative for confusion.     Physical Exam Updated Vital Signs BP 110/68   Pulse (!) 55   Temp 98.1 F (36.7 C) (Oral)   Resp 16   SpO2 100%   Physical Exam  Constitutional: She appears well-developed.  HENT:  Head: Atraumatic.  Eyes: Pupils are equal, round, and reactive to light.  Cardiovascular: Normal rate.  Pulmonary/Chest: She exhibits no tenderness.  Mild diffuse wheezes and mildly prolonged expirations.  Abdominal: There is no tenderness.  Musculoskeletal: She exhibits no tenderness.  Neurological: She is alert.  Skin: Skin is warm. Capillary refill takes less than 2 seconds.     ED Treatments / Results  Labs (all labs ordered are listed, but only abnormal results are displayed) Labs Reviewed  COMPREHENSIVE METABOLIC PANEL - Abnormal; Notable for the following components:      Result Value   CO2 20 (*)    Glucose, Bld 105 (*)    Calcium 8.7 (*)     ALT 7 (*)    All other components within normal limits  CBC WITH DIFFERENTIAL/PLATELET - Abnormal; Notable for the following components:   Platelets 125 (*)    All other components within normal limits  I-STAT CG4 LACTIC ACID, ED  I-STAT BETA HCG BLOOD, ED ([facility], [facility], [facility] ONLY)    EKG  EKG Interpretation  Date/Time:  Sunday January 07 2018 08:40:17 EST Ventricular Rate:  62 PR Interval:  160 QRS Duration: 80 QT Interval:  462 QTC Calculation: 468 R Axis:   83 Text Interpretation:  Normal sinus rhythm Cannot rule out Anterior infarct , age undetermined Abnormal ECG Confirmed by Kayven Aldaco ([id number]) on 01/07/2018 11:58:08 AM       Radiology Dg Chest 2 View  Result Date: 01/07/2018 CLINICAL DATA:  Shortness of breath.  Possible aspiration. EXAM: CHEST  2 VIEW COMPARISON:  CT chest dated May 08, 2017. Chest x-ray dated October 23, 2015. FINDINGS: The heart size and mediastinal contours are within normal limits. Normal pulmonary vascularity. Coarsened interstitial markings, likely smoking-related. No focal consolidation, pleural effusion, or pneumothorax. No acute osseous abnormality IMPRESSION: No active cardiopulmonary disease. Electronically Signed   By: William T Derry M.D.   On: 01/07/2018 09:14    Procedures Procedures (including critical care time)  Medications Ordered in ED Medications  albuterol (PROVENTIL HFA;VENTOLIN HFA) 108 (90 Base) MCG/ACT inhaler 2 puff (2 puffs Inhalation Given 01/07/18 1226)  predniSONE (DELTASONE) tablet 40 mg (40 mg Oral Given 01/07/18 1226)     Initial Impression / Assessment and Plan / ED Course  I have reviewed the triage vital signs and the nursing notes.  Pertinent labs & imaging results that were available during my care of the patient were reviewed by me and considered in my medical decision making (see chart for details).    Patient with pill aspiration.  Has since coughed up.  X-ray and lab work reassuring.  Doubt cardiac cause of  this pain in her chest.  Will give short course of prednisone due to her emphysema increased shortness of breath.  X-ray does not show pneumonia.  Will discharge home to follow-up with her PCP.  Given an inhaler in the ER. Final Clinical Impressions(s) / ED Diagnoses   Final diagnoses:  Aspiration into airway, initial encounter    ED Discharge Orders        Ordered    predniSONE (DELTASONE) 10 MG tablet  Daily     02 /17/19 1240       Davonna Belling, MD 01/07/18 1242

## 2018-01-07 NOTE — ED Notes (Signed)
Discharge vitals in and pt getting dressed at this time.

## 2018-01-07 NOTE — ED Notes (Signed)
Pt verbalized understanding of prescriptions, f/u, and instructions. Ambulatory to lobby with steady gait with daughter. All belongings with pt.

## 2018-01-07 NOTE — ED Notes (Signed)
Pt moved to hallway so EMS pt could be moved into room.

## 2019-12-07 ENCOUNTER — Other Ambulatory Visit: Payer: Self-pay

## 2019-12-07 ENCOUNTER — Encounter (HOSPITAL_COMMUNITY): Payer: Self-pay | Admitting: Emergency Medicine

## 2019-12-07 ENCOUNTER — Emergency Department (HOSPITAL_COMMUNITY)
Admission: EM | Admit: 2019-12-07 | Discharge: 2019-12-08 | Disposition: A | Discharge status: Home / Self Care | Payer: Medicaid Other | Attending: Emergency Medicine | Admitting: Emergency Medicine

## 2019-12-07 ENCOUNTER — Emergency Department (HOSPITAL_COMMUNITY): Payer: Medicaid Other

## 2019-12-07 DIAGNOSIS — R569 Unspecified convulsions: Secondary | ICD-10-CM | POA: Insufficient documentation

## 2019-12-07 DIAGNOSIS — I10 Essential (primary) hypertension: Secondary | ICD-10-CM | POA: Diagnosis not present

## 2019-12-07 DIAGNOSIS — F17210 Nicotine dependence, cigarettes, uncomplicated: Secondary | ICD-10-CM | POA: Diagnosis not present

## 2019-12-07 LAB — CBC WITH DIFFERENTIAL/PLATELET
Abs Immature Granulocytes: 0.02 10*3/uL (ref 0.00–0.07)
Basophils Absolute: 0 10*3/uL (ref 0.0–0.1)
Basophils Relative: 1 %
Eosinophils Absolute: 0 10*3/uL (ref 0.0–0.5)
Eosinophils Relative: 1 %
HCT: 44.1 % (ref 36.0–46.0)
Hemoglobin: 14.2 g/dL (ref 12.0–15.0)
Immature Granulocytes: 0 %
Lymphocytes Relative: 11 %
Lymphs Abs: 0.6 10*3/uL — ABNORMAL LOW (ref 0.7–4.0)
MCH: 31.4 pg (ref 26.0–34.0)
MCHC: 32.2 g/dL (ref 30.0–36.0)
MCV: 97.6 fL (ref 80.0–100.0)
Monocytes Absolute: 0.3 10*3/uL (ref 0.1–1.0)
Monocytes Relative: 5 %
Neutro Abs: 5 10*3/uL (ref 1.7–7.7)
Neutrophils Relative %: 82 %
Platelets: 143 10*3/uL — ABNORMAL LOW (ref 150–400)
RBC: 4.52 MIL/uL (ref 3.87–5.11)
RDW: 13.9 % (ref 11.5–15.5)
WBC: 6.1 10*3/uL (ref 4.0–10.5)
nRBC: 0 % (ref 0.0–0.2)

## 2019-12-07 LAB — I-STAT BETA HCG BLOOD, ED (MC, WL, AP ONLY): I-stat hCG, quantitative: <5 m[IU]/mL (ref <5)

## 2019-12-07 LAB — BASIC METABOLIC PANEL
Anion gap: 10 (ref 5–15)
BUN: 15 mg/dL (ref 6–20)
CO2: 22 mmol/L (ref 22–32)
Calcium: 9 mg/dL (ref 8.9–10.3)
Chloride: 104 mmol/L (ref 98–111)
Creatinine, Ser: 0.75 mg/dL (ref 0.44–1.00)
GFR calc Af Amer: >60 mL/min (ref >60)
GFR calc non Af Amer: >60 mL/min (ref >60)
Glucose, Bld: 96 mg/dL (ref 70–99)
Potassium: 4.5 mmol/L (ref 3.5–5.1)
Sodium: 136 mmol/L (ref 135–145)

## 2019-12-07 NOTE — ED Triage Notes (Addendum)
Per EMS: pt from home with a witnessed seizure that lasted about 2 minutes per family.  EMS arrived, pt postictal and combative.  EMS administered 5 of Versed PTA.  Pt has a history of seizures per family.  Pt's husband/BF reported to EMS pt may have "done crack" and THC.    EMS Vitals:  BP 134/80 HR 70 CBG 166 Temp 98.7 SP02 100% on 2L

## 2019-12-07 NOTE — Discharge Instructions (Addendum)
You are seen in the ER for seizures. It is unclear why you had a seizure-like episode.  It could be because of prior drug use or because of stress.  We recommend that you follow-up with a neurologist.  [place] law prevents people with seizures or fainting from driving or operating dangerous machinery until they are free of seizures or fainting for 6 months.

## 2019-12-07 NOTE — ED Notes (Signed)
Patient verbalizes understanding of discharge instructions. Opportunity for questioning and answers were provided. Armband removed by staff, pt discharged from ED.  

## 2019-12-07 NOTE — ED Provider Notes (Signed)
[facility] EMERGENCY DEPARTMENT Provider Note   CSN: [id number] Arrival date & time: 12/07/19  1042     History Chief Complaint  Patient presents with  . Seizures    Alyssa Shaffer is a 44 y.o. female.  HPI     44 year old female comes in with chief complaint of seizure-like episode. Per EMS patient had a witnessed seizure that lasted about 2 minutes per family.  When they arrived patient was combative and postictal.  Patient was given 5 mg of Versed prior to ED arrival  According to the family patient has history of seizure disorder.  Patient denies any history of seizures.  She is not sure why she is in the ER.  She denies any recent infection, fevers, chills, substance abuse.   Her only complaint is tongue pain. Past Medical History:  Diagnosis Date  . Cancer ([place])   . Cyst of fallopian tube 08/02/2013  . Hypertension     Patient Active Problem List   Diagnosis Date Noted  . 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 Intractable pain 07/28/2016  . HTN (hypertension) 07/28/2016  . Herpes zoster 07/28/2016  . Chronic pelvic pain in female 09/28/2013    Past Surgical History:  Procedure Laterality Date  . CERVIX REMOVAL     partial  . TONSILLECTOMY    . TUBAL LIGATION       OB History   No obstetric history on file.     Family History  Problem Relation Age of Onset  . Heart disease Mother   . Seizures Mother   . Hypertension Father   . Heart disease Father     Social History   Tobacco Use  . Smoking status: Current Every Day Smoker    Packs/day: 1.00    Years: 27.00    Pack years: 27.00  . Smokeless tobacco: Never Used  Substance Use Topics  . Alcohol use: Yes    Comment: socailly  . Drug use: No    Home Medications Prior to Admission medications   Not on File    Allergies    Bee venom and Meloxicam  Review of  Systems   Review of Systems  Constitutional: Positive for activity change.  Gastrointestinal: Negative for nausea and vomiting.  Musculoskeletal: Negative for arthralgias and myalgias.  Neurological: Positive for seizures.  Hematological: Does not bruise/bleed easily.  All other systems reviewed and are negative.   Physical Exam Updated Vital Signs BP 115/77   Pulse (!) 57   Temp 97.9 F (36.6 C) (Oral)   Resp 13   LMP 11/30/2019 (Approximate)   SpO2 96%   Physical Exam Vitals and nursing note reviewed.  Constitutional:      Appearance: She is well-developed.  HENT:     Head: Atraumatic.     Comments: Tongue lac, nonbleeding Cardiovascular:     Rate and Rhythm: Normal rate.  Pulmonary:     Effort: Pulmonary effort is normal.  Abdominal:     General: Bowel sounds are normal.  Musculoskeletal:     Cervical back: Normal range of motion.  Skin:    General: Skin is warm and dry.  Neurological:     Mental Status: She is alert and oriented to person, place, and time.     Cranial Nerves: No cranial nerve deficit.     Sensory: No sensory deficit.     ED Results / Procedures / Treatments  Labs (all labs ordered are listed, but only abnormal results are displayed) Labs Reviewed  CBC WITH DIFFERENTIAL/PLATELET - Abnormal; Notable for the following components:      Result Value   Platelets 143 (*)    Lymphs Abs 0.6 (*)    All other components within normal limits  BASIC METABOLIC PANEL  I-STAT BETA HCG BLOOD, ED ([facility], [facility], [facility] ONLY)  CBG MONITORING, ED    EKG EKG Interpretation  Date/Time:  Saturday December 07 2019 10:48:15 EST Ventricular Rate:  67 PR Interval:    QRS Duration: 90 QT Interval:  428 QTC Calculation: 452 R Axis:   87 Text Interpretation: Sinus rhythm No acute changes No significant change since last tracing Confirmed by Varney Biles [id number]) on 12/07/2019 11:36:44 AM   Radiology CT Head Wo Contrast  Result Date: 12/07/2019 CLINICAL DATA:   44 year old female with history of seizure. EXAM: CT HEAD WITHOUT CONTRAST TECHNIQUE: Contiguous axial images were obtained from the base of the skull through the vertex without intravenous contrast. COMPARISON:  Head CT 05/08/2017. FINDINGS: Brain: No evidence of acute infarction, hemorrhage, hydrocephalus, extra-axial collection or mass lesion/mass effect. Vascular: No hyperdense vessel or unexpected calcification. Skull: Normal. Negative for fracture or focal lesion. Sinuses/Orbits: No acute finding. Other: None. IMPRESSION: 1. No acute intracranial abnormalities. The appearance of the brain is normal. Electronically Signed   By: Vinnie Langton M.D.   On: 12/07/2019 12:31    Procedures Procedures (including critical care time)  Medications Ordered in ED Medications - No data to display  ED Course  I have reviewed the triage vital signs and the nursing notes.  Pertinent labs & imaging results that were available during my care of the patient were reviewed by me and considered in my medical decision making (see chart for details).    MDM Rules/Calculators/A&P                      DDx: -Seizure disorder -Meningitis -Trauma -ICH -Electrolyte abnormality -Metabolic derangement -Stroke -Toxin induced seizures -Medication side effects -Hypoxia -Hypoglycemia  44 year old female chief complaint of seizure.  Patient is not able to provide any meaningful history.  She has no history of seizure disorder and denies any headaches, neck pain, fevers, chills, recent infections.  She does admit to high-level stress and occasional drug use.  We will get basic labs.  CT head ordered.  2:30 PM No seizure-like activity in the ED.  Patient monitored for 3-1/2 hours in the ED.  She is stable for discharge. We have attempted to reach family twice.  It seems like the spouse's emergency number is now held by a different person who is not related to Alyssa Shaffer.  We called the home and the phone is  going to answering service.  Strict ER return precautions have been discussed, and patient is agreeing with the Shaffer and is comfortable with the workup done and the recommendations from the ER.   Final Clinical Impression(s) / ED Diagnoses Final diagnoses:  Seizures [facility])    Rx / DC Orders ED Discharge Orders    None       Varney Biles, MD 12/07/19 1431

## 2019-12-07 NOTE — ED Notes (Signed)
Patient transported to CT 

## 2020-01-03 ENCOUNTER — Ambulatory Visit: Payer: Medicaid Other | Admitting: Family Medicine

## 2020-01-08 ENCOUNTER — Ambulatory Visit: Payer: Medicaid Other | Admitting: Family Medicine

## 2020-01-24 ENCOUNTER — Ambulatory Visit: Payer: Medicaid Other | Admitting: Family Medicine

## 2020-02-02 ENCOUNTER — Emergency Department (HOSPITAL_COMMUNITY): Payer: Medicaid Other

## 2020-02-02 ENCOUNTER — Encounter (HOSPITAL_COMMUNITY): Payer: Self-pay | Admitting: Emergency Medicine

## 2020-02-02 ENCOUNTER — Other Ambulatory Visit: Payer: Self-pay

## 2020-02-02 ENCOUNTER — Emergency Department (HOSPITAL_COMMUNITY)
Admission: EM | Admit: 2020-02-02 | Discharge: 2020-02-02 | Disposition: A | Discharge status: Home / Self Care | Payer: Medicaid Other | Attending: Emergency Medicine | Admitting: Emergency Medicine

## 2020-02-02 DIAGNOSIS — Z859 Personal history of malignant neoplasm, unspecified: Secondary | ICD-10-CM | POA: Diagnosis not present

## 2020-02-02 DIAGNOSIS — F17210 Nicotine dependence, cigarettes, uncomplicated: Secondary | ICD-10-CM | POA: Diagnosis not present

## 2020-02-02 DIAGNOSIS — W010XXA Fall on same level from slipping, tripping and stumbling without subsequent striking against object, initial encounter: Secondary | ICD-10-CM | POA: Diagnosis not present

## 2020-02-02 DIAGNOSIS — S2231XA Fracture of one rib, right side, initial encounter for closed fracture: Secondary | ICD-10-CM

## 2020-02-02 DIAGNOSIS — Y998 Other external cause status: Secondary | ICD-10-CM | POA: Diagnosis not present

## 2020-02-02 DIAGNOSIS — S299XXA Unspecified injury of thorax, initial encounter: Secondary | ICD-10-CM | POA: Diagnosis present

## 2020-02-02 DIAGNOSIS — Y9301 Activity, walking, marching and hiking: Secondary | ICD-10-CM | POA: Diagnosis not present

## 2020-02-02 DIAGNOSIS — Y92017 Garden or yard in single-family (private) house as the place of occurrence of the external cause: Secondary | ICD-10-CM | POA: Diagnosis not present

## 2020-02-02 LAB — POC URINE PREG, ED: Preg Test, Ur: NEGATIVE

## 2020-02-02 MED ORDER — OXYCODONE-ACETAMINOPHEN 5-325 MG PO TABS
2.0000 | ORAL_TABLET | Freq: Once | ORAL | Status: AC
  Administered 2020-02-02: 04:00:00 2 via ORAL
  Filled 2020-02-02: qty 2

## 2020-02-02 MED ORDER — LIDOCAINE 5 % EX PTCH: 1.0000 | MEDICATED_PATCH | CUTANEOUS | 0 refills | Status: DC

## 2020-02-02 MED ORDER — IBUPROFEN 800 MG PO TABS: 800.0000 mg | ORAL_TABLET | Freq: Three times a day (TID) | ORAL | 0 refills | Status: DC | PRN

## 2020-02-02 MED ORDER — OXYCODONE-ACETAMINOPHEN 5-325 MG PO TABS: 2.0000 | ORAL_TABLET | Freq: Four times a day (QID) | ORAL | 0 refills | Status: AC | PRN

## 2020-02-02 NOTE — Discharge Instructions (Addendum)
I recommend that you use your incentive spirometer every 1-2 hours while awake for the next 2 weeks.  Please follow-up with your primary care physician for further pain management.  Steps to find a Primary Care Provider (PCP):  Call [phone] or [phone] to access "[place]."  2.  You may also go on the [facility] website at [email or web address]  3.  [facility] also frequently accepts new patients.  [place] [phone]  4.  There are also multiple [facility], [facility] and [facility] practices throughout the [place] that are frequently accepting new patients. You may find a clinic that is close to your home and contact them.  [facility] [email or web address] [phone]  [facility] [email or web address]  [email or web address] [email or web address] [phone]  5.  Local Health Departments also can provide primary care services.  [facility]  [place] [address] [phone]  [facility] [place] [address] [place] [facility] [place] [phone]
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
## 2021-10-11 ENCOUNTER — Other Ambulatory Visit (HOSPITAL_COMMUNITY)
Admission: RE | Admit: 2021-10-11 | Discharge: 2021-10-11 | Disposition: A | Discharge status: Home / Self Care | Payer: Medicaid Other | Source: Ambulatory Visit | Attending: Family Medicine | Admitting: Family Medicine

## 2021-10-11 ENCOUNTER — Other Ambulatory Visit: Payer: Self-pay

## 2021-10-11 ENCOUNTER — Ambulatory Visit (INDEPENDENT_AMBULATORY_CARE_PROVIDER_SITE_OTHER): Payer: Medicaid Other | Admitting: Family Medicine

## 2021-10-11 ENCOUNTER — Telehealth: Payer: Self-pay

## 2021-10-11 ENCOUNTER — Other Ambulatory Visit: Payer: Self-pay | Admitting: Family Medicine

## 2021-10-11 ENCOUNTER — Encounter: Payer: Self-pay | Admitting: Family Medicine

## 2021-10-11 VITALS — BP 118/79 | HR 77 | Wt 116.2 lb

## 2021-10-11 DIAGNOSIS — N6323 Unspecified lump in the left breast, lower outer quadrant: Secondary | ICD-10-CM

## 2021-10-11 DIAGNOSIS — Z01419 Encounter for gynecological examination (general) (routine) without abnormal findings: Secondary | ICD-10-CM | POA: Insufficient documentation

## 2021-10-11 DIAGNOSIS — G8929 Other chronic pain: Secondary | ICD-10-CM | POA: Diagnosis not present

## 2021-10-11 DIAGNOSIS — Z124 Encounter for screening for malignant neoplasm of cervix: Secondary | ICD-10-CM

## 2021-10-11 DIAGNOSIS — Z1211 Encounter for screening for malignant neoplasm of colon: Secondary | ICD-10-CM | POA: Diagnosis not present

## 2021-10-11 DIAGNOSIS — R102 Pelvic and perineal pain: Secondary | ICD-10-CM

## 2021-10-11 DIAGNOSIS — Z720 Tobacco use: Secondary | ICD-10-CM | POA: Diagnosis not present

## 2021-10-11 DIAGNOSIS — Z01411 Encounter for gynecological examination (general) (routine) with abnormal findings: Secondary | ICD-10-CM | POA: Diagnosis not present

## 2021-10-11 NOTE — Progress Notes (Signed)
Subjective:     Alyssa Shaffer is a 45 y.o. female and is here for a comprehensive physical exam. The patient reports problems - left sided pelvic pain.  Notes that pain is worse with eating and drinking. Has h/o pancreatitis. Thinks that it is her pelvic organs that are bothering her. She, also, has a left sided mass in her breast that is tender. Having regular cycles.  The following portions of the patient's history were reviewed and updated as appropriate: allergies, current medications, past family history, past medical history, past social history, past surgical history, and problem list.  Review of Systems Pertinent items noted in HPI and remainder of comprehensive ROS otherwise negative.   Objective:    BP 118/79   Pulse 77   Wt 116 lb 3.2 oz (52.7 kg)   LMP 10/08/2021 (Exact Date)   BMI 20.58 kg/m  General appearance: alert, cooperative, and appears stated age Head: Normocephalic, without obvious abnormality, atraumatic Neck: no adenopathy, supple, symmetrical, trachea midline, and thyroid not enlarged, symmetric, no tenderness/mass/nodules Lungs: clear to auscultation bilaterally Breasts:  bilateral fibrocystic changes L>R mobile and firm Heart: regular rate and rhythm Abdomen: soft, non-tender; bowel sounds normal; no masses,  no organomegaly Pelvic: cervix normal in appearance, external genitalia normal, no cervical motion tenderness, uterus normal size, shape, and consistency, vagina normal without discharge, and considerable left sided tenderness, ? Fullness there Extremities: extremities normal, atraumatic, no cyanosis or edema Pulses: 2+ and symmetric Skin: Skin color, texture, turgor normal. No rashes or lesions Lymph nodes: Cervical, supraclavicular, and axillary nodes normal. Neurologic: Grossly normal    Assessment:    GYN female exam.      Plan:   Problem List Items Addressed This Visit       Unprioritized   Chronic pelvic pain in female     Check u/s--discussed food does not usually impact GYN organs--will refer to GI for colonoscopy, check pelvic sono      Relevant Orders   US PELVIC COMPLETE WITH TRANSVAGINAL   Tobacco use    Desires Chantix. Will defer to PCP. Smoking and tobacco cessation was discussed at today's visit for 3 minutes         Other Visit Diagnoses     Well woman exam    -  Primary   Relevant Orders   Cytology - PAP( [place])   MM Digital Screening   Screening for malignant neoplasm of cervix       Encounter for gynecological examination with abnormal finding       Screen for colon cancer       Relevant Orders   Ambulatory referral to Gastroenterology   Mass of lower outer quadrant of left breast       Relevant Orders   MM DIAG BREAST TOMO BILATERAL      Return in 1 year (on 10/11/2022).    See After Visit Summary for Counseling Recommendations

## 2021-10-11 NOTE — Patient Instructions (Signed)

## 2021-10-11 NOTE — Assessment & Plan Note (Addendum)
[id number] Desires Chantix. Will defer to PCP. Smoking and tobacco cessation was discussed at today's visit for 3 minutes

## 2021-10-11 NOTE — Telephone Encounter (Signed)
Pt requested for me to call her daughter Alyse Low with her appts, so called the# given [phone], no answer, left VM of Mammogram appt on 11/05/21 @ 9:20a and U/S Pelvic 10/20/21 b@ 10am at [facility].

## 2021-10-11 NOTE — Assessment & Plan Note (Addendum)
[id number] Check u/s--discussed food does not usually impact GYN organs--will refer to GI for colonoscopy, check pelvic sono

## 2021-10-11 NOTE — Progress Notes (Signed)
Cyst on fallopian tube per patient. States pain is almost unbearably, describes as a "stabbing pain, crying in pain". Takes Tylenol for pain but does not help. States in 2006 she was dx with cervical cancer. Would like to talk about hysterectomy. Requesting refill on Percocet.   Altha Harm, CMA

## 2021-10-18 LAB — CYTOLOGY - PAP
Comment: NEGATIVE
Diagnosis: NEGATIVE
High risk HPV: NEGATIVE

## 2021-10-20 ENCOUNTER — Ambulatory Visit (HOSPITAL_COMMUNITY): Payer: Medicaid Other

## 2021-10-22 ENCOUNTER — Other Ambulatory Visit: Payer: Self-pay

## 2021-10-22 ENCOUNTER — Ambulatory Visit (HOSPITAL_COMMUNITY)
Admission: RE | Admit: 2021-10-22 | Discharge: 2021-10-22 | Disposition: A | Discharge status: Home / Self Care | Payer: Medicaid Other | Source: Ambulatory Visit | Attending: Family Medicine | Admitting: Family Medicine

## 2021-10-22 DIAGNOSIS — G8929 Other chronic pain: Secondary | ICD-10-CM | POA: Diagnosis present

## 2021-10-22 DIAGNOSIS — R102 Pelvic and perineal pain: Secondary | ICD-10-CM | POA: Diagnosis not present

## 2021-10-26 ENCOUNTER — Telehealth: Payer: Self-pay | Admitting: *Deleted

## 2021-10-26 NOTE — Telephone Encounter (Addendum)
-----   Message from Donnamae Jude, MD sent at 10/25/2021  6:11 PM EST ----- Please schedule virtual or other visit to discuss u/s results.  12/6  1700  Called pt and left message stating that we will be scheduling office appointment to discuss test results.  She will be contacted with the appt information.

## 2021-10-27 ENCOUNTER — Telehealth: Payer: Self-pay | Admitting: Family Medicine

## 2021-10-27 NOTE — Telephone Encounter (Signed)
Called patient in regards to setting up an appointment to go over lab results, there was no answer to the phone call so a voice mail was left with the call back number for the office.

## 2021-10-28 ENCOUNTER — Telehealth: Payer: Self-pay

## 2021-10-28 NOTE — Telephone Encounter (Signed)
Patient left VM requesting results from US performed at [facility]. Per chart review, [place] with front office attempted to contact patient to schedule appt to review results. Appt scheduled with Dr. Kennon Rounds on 11/18/21. Called pt. VM left stating she has been scheduled for an appt to discuss results on 11/18/21 and may contact the office prior with any concerns.

## 2021-11-05 ENCOUNTER — Other Ambulatory Visit: Payer: Medicaid Other

## 2021-11-16 ENCOUNTER — Ambulatory Visit: Payer: Medicaid Other

## 2021-11-18 ENCOUNTER — Encounter: Payer: Self-pay | Admitting: Family Medicine

## 2021-11-18 ENCOUNTER — Other Ambulatory Visit: Payer: Self-pay

## 2021-11-18 ENCOUNTER — Telehealth (INDEPENDENT_AMBULATORY_CARE_PROVIDER_SITE_OTHER): Payer: Medicaid Other | Admitting: Family Medicine

## 2021-11-18 DIAGNOSIS — R102 Pelvic and perineal pain: Secondary | ICD-10-CM | POA: Diagnosis not present

## 2021-11-18 DIAGNOSIS — G8929 Other chronic pain: Secondary | ICD-10-CM | POA: Diagnosis not present

## 2021-11-18 DIAGNOSIS — Z720 Tobacco use: Secondary | ICD-10-CM

## 2021-11-18 MED ORDER — VARENICLINE TARTRATE 0.5 MG PO TABS: 0.5000 mg | ORAL_TABLET | Freq: Two times a day (BID) | ORAL | 0 refills | Status: DC

## 2021-11-18 MED ORDER — VARENICLINE TARTRATE 1 MG PO TABS: 1.0000 mg | ORAL_TABLET | Freq: Two times a day (BID) | ORAL | 0 refills | Status: DC

## 2021-11-18 NOTE — Assessment & Plan Note (Addendum)
Still having pain - feels like knives stabbing her in the stomach when she eats a lot. Given link to pelvic congestion and discussed possible treatment options.

## 2021-11-18 NOTE — Patient Instructions (Signed)
Pelvic Congestion Syndrome   [facility]

## 2021-11-18 NOTE — Assessment & Plan Note (Signed)
Trial of Chantix, setting quit date discussed Smoking and tobacco cessation was discussed at today's visit for 5 minutes

## 2021-11-18 NOTE — Progress Notes (Signed)
GYNECOLOGY VIRTUAL VISIT ENCOUNTER NOTE  Provider location: [facility] for [place] [facility]   Patient location: Home  I connected with Alyssa Shaffer on 11/18/21 at  2:15 PM EST by MyChart Video Encounter and verified that I am speaking with the correct person using two identifiers.   I discussed the limitations, risks, security and privacy concerns of performing an evaluation and management service virtually and the availability of in person appointments. I also discussed with the patient that there may be a patient responsible charge related to this service. The patient expressed understanding and agreed to proceed.   History:  Alyssa Shaffer is a 45 y.o. [phone] female being evaluated today for pelvic pain. Has u/s which shows several super small fibroids and dilated pelvic vasculature, which could be c/w pelvic congestion.  She denies any abnormal vaginal discharge, bleeding, or other concerns. She would like to quit smoking. Has successfully used Chantix in the past.       Past Medical History:  Diagnosis Date   Cancer ([facility])    Cyst of fallopian tube 08/02/2013   Hypertension    Past Surgical History:  Procedure Laterality Date   CERVIX REMOVAL     partial   TONSILLECTOMY     TUBAL LIGATION     The following portions of the patient's history were reviewed and updated as appropriate: allergies, current medications, past family history, past medical history, past social history, past surgical history and problem list.   Health Maintenance:  Normal pap and negative HRHPV on 10/11/2021.  Needs mammogram.   Review of Systems:  Pertinent items noted in HPI and remainder of comprehensive ROS otherwise negative.  Physical Exam:   General:  Alert, oriented and cooperative. Patient appears to be in no acute distress.  Mental Status: Normal mood and affect. Normal behavior. Normal judgment and thought content.   Respiratory: Normal  respiratory effort, no problems with respiration noted  Rest of physical exam deferred due to type of encounter  Labs and Imaging No results found for this or any previous visit (from the past 336 hour(s)). US PELVIC COMPLETE WITH TRANSVAGINAL  Result Date: 10/25/2021 CLINICAL DATA:  Chronic pelvic pain EXAM: TRANSABDOMINAL AND TRANSVAGINAL ULTRASOUND OF PELVIS TECHNIQUE: Both transabdominal and transvaginal ultrasound examinations of the pelvis were performed. Transabdominal technique was performed for global imaging of the pelvis including uterus, ovaries, adnexal regions, and pelvic cul-de-sac. It was necessary to proceed with endovaginal exam following the transabdominal exam to visualize the right ovary. COMPARISON:  None FINDINGS: Uterus Measurements: 11.1 x 6.8 x 7.7 cm = volume: 303 mL. Multiple uterine fibroids are seen. Fibroid 1: 1.8 x 1.3 x 1.5 cm partially calcified fibroid noted in the left anterior uterine body. Fibroid 2: 1.4 x 1.3 x 1.2 cm partially degenerated fibroid noted in the right anterior uterine fundus. Fibroid 3: 1.8 x 1.8 x 1.2 cm partially calcified fibroid noted in the left anterior uterine fundus. Endometrium Thickness: 11.0 mm.  No focal abnormality visualized. Right ovary Measurements: 1.4 x 1.1 x 2.5 cm = volume: 1.8 mL. Normal appearance. Left ovary Measurements: 2.5 x 1.7 x 1.9 cm = volume: 3.9 mL. Normal appearance. Other findings No abnormal free fluid. IMPRESSION: 1. Fibroid uterus. 2. Prominent bilateral adnexal veins can be seen with pelvic congestion syndrome. Please correlate with patient's symptoms. Electronically Signed   By: Miachel Roux M.D.   On: 10/25/2021 08:39       Assessment and Plan:  Problem List Items Addressed This Visit       Unprioritized   Chronic pelvic pain in female    Still having pain - feels like knives stabbing her in the stomach when she eats a lot. Given link to pelvic congestion and discussed possible treatment options.       Tobacco use - Primary    Trial of Chantix, setting quit date discussed Smoking and tobacco cessation was discussed at today's visit for 5 minutes         Relevant Medications   varenicline (CHANTIX) 0.5 MG tablet   varenicline (CHANTIX CONTINUING MONTH PAK) 1 MG tablet         I discussed the assessment and treatment plan with the patient. The patient was provided an opportunity to ask questions and all were answered. The patient agreed with the plan and demonstrated an understanding of the instructions.   The patient was advised to call back or seek an in-person evaluation/go to the ED if the symptoms worsen or if the condition fails to improve as anticipated.  I provided 12 minutes of face-to-face time during this encounter.   Donnamae Jude, MD Center for [facility], [place]

## 2021-11-19 ENCOUNTER — Telehealth: Payer: Self-pay

## 2021-11-19 NOTE — Telephone Encounter (Signed)
Dr. Kennon Rounds request to reschedule pt's mammogram.   Mammogram rescheduled for February 8th @ 1510.  Pt notified of appt.  Pt verbalized understanding with no further questions.   Mel Almond, RN  11/19/21

## 2021-12-15 ENCOUNTER — Other Ambulatory Visit: Payer: Medicaid Other

## 2021-12-29 ENCOUNTER — Other Ambulatory Visit: Payer: Medicaid Other

## 2021-12-29 ENCOUNTER — Inpatient Hospital Stay: Admission: RE | Admit: 2021-12-29 | Payer: Medicaid Other | Source: Ambulatory Visit

## 2022-01-01 ENCOUNTER — Emergency Department (HOSPITAL_COMMUNITY)
Admission: EM | Admit: 2022-01-01 | Discharge: 2022-01-01 | Disposition: A | Discharge status: Home / Self Care | Payer: Medicaid Other | Attending: Emergency Medicine | Admitting: Emergency Medicine

## 2022-01-01 ENCOUNTER — Encounter (HOSPITAL_COMMUNITY): Payer: Self-pay | Admitting: Emergency Medicine

## 2022-01-01 ENCOUNTER — Other Ambulatory Visit: Payer: Self-pay

## 2022-01-01 ENCOUNTER — Emergency Department (HOSPITAL_COMMUNITY): Payer: Medicaid Other

## 2022-01-01 DIAGNOSIS — Y92410 Unspecified street and highway as the place of occurrence of the external cause: Secondary | ICD-10-CM | POA: Insufficient documentation

## 2022-01-01 DIAGNOSIS — M25512 Pain in left shoulder: Secondary | ICD-10-CM | POA: Insufficient documentation

## 2022-01-01 DIAGNOSIS — M25552 Pain in left hip: Secondary | ICD-10-CM | POA: Insufficient documentation

## 2022-01-01 DIAGNOSIS — R0781 Pleurodynia: Secondary | ICD-10-CM | POA: Diagnosis not present

## 2022-01-01 DIAGNOSIS — W19XXXA Unspecified fall, initial encounter: Secondary | ICD-10-CM

## 2022-01-01 DIAGNOSIS — G8911 Acute pain due to trauma: Secondary | ICD-10-CM | POA: Insufficient documentation

## 2022-01-01 DIAGNOSIS — M25559 Pain in unspecified hip: Secondary | ICD-10-CM

## 2022-01-01 MED ORDER — METHOCARBAMOL 500 MG PO TABS
500.0000 mg | ORAL_TABLET | Freq: Once | ORAL | Status: AC
  Administered 2022-01-01: 500 mg via ORAL
  Filled 2022-01-01: qty 1

## 2022-01-01 MED ORDER — OXYCODONE-ACETAMINOPHEN 5-325 MG PO TABS
1.0000 | ORAL_TABLET | Freq: Once | ORAL | Status: AC
  Administered 2022-01-01: 1 via ORAL
  Filled 2022-01-01: qty 1

## 2022-01-01 MED ORDER — HYDROCODONE-ACETAMINOPHEN 5-325 MG PO TABS
1.0000 | ORAL_TABLET | Freq: Once | ORAL | Status: AC
  Administered 2022-01-01: 1 via ORAL
  Filled 2022-01-01: qty 1

## 2022-01-01 MED ORDER — METHOCARBAMOL 500 MG PO TABS: 500.0000 mg | ORAL_TABLET | Freq: Two times a day (BID) | ORAL | 0 refills | Status: DC

## 2022-01-01 MED ORDER — LIDOCAINE 5 % EX PTCH: 1.0000 | MEDICATED_PATCH | CUTANEOUS | 0 refills | Status: DC
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
## 2022-01-01 NOTE — Progress Notes (Signed)
Orthopedic Tech Progress Note Patient Details:  Alyssa Shaffer 1976-06-07 [id number]  Ortho Devices Type of Ortho Device: Shoulder immobilizer Ortho Device/Splint Location: Left arm Ortho Device/Splint Interventions: Application   Post Interventions Patient Tolerated: Well  Linus Salmons Avaiyah Strubel 01/01/2022, 4:38 PM
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
## 2022-01-01 NOTE — Discharge Instructions (Addendum)
Take the medications as prescribed  Use the breathing machine twice every hour while awake  Follow-up with orthopedic

## 2022-01-01 NOTE — ED Notes (Signed)
Discharge instructions reviewed with patient and family member. Patient and family member verbalized understanding of instructions. Follow-up care and medications were reviewed. Patient ambulatory with steady gait. VSS upon discharge.

## 2022-02-02 ENCOUNTER — Other Ambulatory Visit: Payer: Self-pay | Admitting: Family Medicine

## 2022-02-02 DIAGNOSIS — Z720 Tobacco use: Secondary | ICD-10-CM

## 2022-03-21 ENCOUNTER — Encounter: Payer: Self-pay | Admitting: Family Medicine
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
## 2023-11-01 ENCOUNTER — Ambulatory Visit (HOSPITAL_COMMUNITY)
Admission: EM | Admit: 2023-11-01 | Discharge: 2023-11-01 | Disposition: A | Discharge status: Home / Self Care | Payer: Medicaid Other | Attending: Emergency Medicine | Admitting: Emergency Medicine

## 2023-11-01 ENCOUNTER — Encounter (HOSPITAL_COMMUNITY): Payer: Self-pay

## 2023-11-01 DIAGNOSIS — T22211A Burn of second degree of right forearm, initial encounter: Secondary | ICD-10-CM

## 2023-11-01 DIAGNOSIS — T23262A Burn of second degree of back of left hand, initial encounter: Secondary | ICD-10-CM | POA: Diagnosis not present

## 2023-11-01 MED ORDER — SILVER SULFADIAZINE 1 % EX CREA
TOPICAL_CREAM | CUTANEOUS | Status: AC
Start: 2023-11-01 — End: ?
  Filled 2023-11-01: qty 85

## 2023-11-01 MED ORDER — SILVER SULFADIAZINE 1 % EX CREA: TOPICAL_CREAM | Freq: Once | CUTANEOUS | Status: AC

## 2023-11-01 MED ORDER — SILVER SULFADIAZINE 1 % EX CREA: 1.0000 | TOPICAL_CREAM | Freq: Every day | CUTANEOUS | 0 refills | Status: AC

## 2023-11-01 MED ORDER — IBUPROFEN 600 MG PO TABS
600.0000 mg | ORAL_TABLET | Freq: Four times a day (QID) | ORAL | 0 refills | Status: AC | PRN
Start: 2023-11-01 — End: ?
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
## 2023-11-01 NOTE — ED Notes (Signed)
Called pt from lobby no answer X 1 
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
